# Patient Record
Sex: Female | Born: 1940 | Race: White | Hispanic: No | Marital: Married | Smoking: Never smoker
Health system: Southern US, Community
[De-identification: ages and names within clinical notes are randomized; demographics above are authoritative.]

## PROBLEM LIST (undated history)

## (undated) DIAGNOSIS — S72001A Fracture of unspecified part of neck of right femur, initial encounter for closed fracture: Secondary | ICD-10-CM

## (undated) DIAGNOSIS — Z789 Other specified health status: Secondary | ICD-10-CM

## (undated) DIAGNOSIS — D249 Benign neoplasm of unspecified breast: Secondary | ICD-10-CM

## (undated) DIAGNOSIS — C801 Malignant (primary) neoplasm, unspecified: Secondary | ICD-10-CM

## (undated) DIAGNOSIS — R51 Headache: Secondary | ICD-10-CM

## (undated) HISTORY — DX: Malignant (primary) neoplasm, unspecified: C80.1

## (undated) HISTORY — DX: Benign neoplasm of unspecified breast: D24.9

## (undated) HISTORY — DX: Fracture of unspecified part of neck of right femur, initial encounter for closed fracture: S72.001A

## (undated) HISTORY — PX: IM NAILING FEMORAL SHAFT FRACTURE: SUR731

---

## 1976-06-02 HISTORY — PX: TUBAL LIGATION: SHX77

## 1999-10-16 ENCOUNTER — Other Ambulatory Visit: Admission: RE | Admit: 1999-10-16 | Discharge: 1999-10-16 | Payer: Self-pay | Admitting: Gynecology

## 2000-10-19 ENCOUNTER — Other Ambulatory Visit: Admission: RE | Admit: 2000-10-19 | Discharge: 2000-10-19 | Payer: Self-pay | Admitting: Gynecology

## 2001-10-14 ENCOUNTER — Encounter: Payer: Self-pay | Admitting: Internal Medicine

## 2001-10-14 ENCOUNTER — Encounter: Admission: RE | Admit: 2001-10-14 | Discharge: 2001-10-14 | Payer: Self-pay | Admitting: Internal Medicine

## 2001-10-19 ENCOUNTER — Other Ambulatory Visit: Admission: RE | Admit: 2001-10-19 | Discharge: 2001-10-19 | Payer: Self-pay | Admitting: Gynecology

## 2001-11-18 ENCOUNTER — Other Ambulatory Visit: Admission: RE | Admit: 2001-11-18 | Discharge: 2001-11-18 | Payer: Self-pay | Admitting: Radiology

## 2001-12-14 ENCOUNTER — Ambulatory Visit (HOSPITAL_COMMUNITY): Admission: RE | Admit: 2001-12-14 | Discharge: 2001-12-14 | Payer: Self-pay | Admitting: *Deleted

## 2002-01-19 ENCOUNTER — Encounter: Payer: Self-pay | Admitting: Internal Medicine

## 2002-01-19 ENCOUNTER — Encounter: Admission: RE | Admit: 2002-01-19 | Discharge: 2002-01-19 | Payer: Self-pay | Admitting: Internal Medicine

## 2002-05-06 ENCOUNTER — Encounter: Admission: RE | Admit: 2002-05-06 | Discharge: 2002-05-06 | Payer: Self-pay | Admitting: Internal Medicine

## 2002-05-06 ENCOUNTER — Encounter: Payer: Self-pay | Admitting: Internal Medicine

## 2002-11-08 ENCOUNTER — Other Ambulatory Visit: Admission: RE | Admit: 2002-11-08 | Discharge: 2002-11-08 | Payer: Self-pay | Admitting: Gynecology

## 2003-06-03 HISTORY — PX: CARDIAC CATHETERIZATION: SHX172

## 2003-10-31 ENCOUNTER — Ambulatory Visit (HOSPITAL_COMMUNITY): Admission: RE | Admit: 2003-10-31 | Discharge: 2003-10-31 | Payer: Self-pay | Admitting: Cardiology

## 2003-11-09 ENCOUNTER — Other Ambulatory Visit: Admission: RE | Admit: 2003-11-09 | Discharge: 2003-11-09 | Payer: Self-pay | Admitting: Gynecology

## 2004-09-03 ENCOUNTER — Encounter: Admission: RE | Admit: 2004-09-03 | Discharge: 2004-09-03 | Payer: Self-pay | Admitting: Gynecology

## 2004-11-12 ENCOUNTER — Other Ambulatory Visit: Admission: RE | Admit: 2004-11-12 | Discharge: 2004-11-12 | Payer: Self-pay | Admitting: Gynecology

## 2005-09-03 ENCOUNTER — Encounter: Admission: RE | Admit: 2005-09-03 | Discharge: 2005-09-03 | Payer: Self-pay | Admitting: Gynecology

## 2005-11-24 ENCOUNTER — Other Ambulatory Visit: Admission: RE | Admit: 2005-11-24 | Discharge: 2005-11-24 | Payer: Self-pay | Admitting: Gynecology

## 2006-09-07 ENCOUNTER — Encounter: Admission: RE | Admit: 2006-09-07 | Discharge: 2006-09-07 | Payer: Self-pay | Admitting: Internal Medicine

## 2006-12-02 ENCOUNTER — Other Ambulatory Visit: Admission: RE | Admit: 2006-12-02 | Discharge: 2006-12-02 | Payer: Self-pay | Admitting: Gynecology

## 2007-09-15 ENCOUNTER — Encounter: Admission: RE | Admit: 2007-09-15 | Discharge: 2007-09-15 | Payer: Self-pay | Admitting: Gynecology

## 2008-09-15 ENCOUNTER — Encounter: Admission: RE | Admit: 2008-09-15 | Discharge: 2008-09-15 | Payer: Self-pay | Admitting: Gynecology

## 2009-09-18 ENCOUNTER — Encounter: Admission: RE | Admit: 2009-09-18 | Discharge: 2009-09-18 | Payer: Self-pay | Admitting: Internal Medicine

## 2010-08-20 ENCOUNTER — Other Ambulatory Visit: Payer: Self-pay | Admitting: Gynecology

## 2010-08-20 DIAGNOSIS — Z1231 Encounter for screening mammogram for malignant neoplasm of breast: Secondary | ICD-10-CM

## 2010-09-13 ENCOUNTER — Other Ambulatory Visit: Payer: Self-pay | Admitting: Internal Medicine

## 2010-09-13 DIAGNOSIS — R61 Generalized hyperhidrosis: Secondary | ICD-10-CM

## 2010-09-17 ENCOUNTER — Ambulatory Visit
Admission: RE | Admit: 2010-09-17 | Discharge: 2010-09-17 | Disposition: A | Discharge status: Home / Self Care | Payer: PRIVATE HEALTH INSURANCE | Source: Ambulatory Visit | Attending: Internal Medicine | Admitting: Internal Medicine

## 2010-09-17 DIAGNOSIS — R61 Generalized hyperhidrosis: Secondary | ICD-10-CM

## 2010-09-17 MED ORDER — IOHEXOL 300 MG/ML  SOLN
75.0000 mL | Freq: Once | INTRAMUSCULAR | Status: AC | PRN
  Administered 2010-09-17: 75 mL via INTRAVENOUS

## 2010-09-20 ENCOUNTER — Ambulatory Visit
Admission: RE | Admit: 2010-09-20 | Discharge: 2010-09-20 | Disposition: A | Discharge status: Home / Self Care | Payer: PRIVATE HEALTH INSURANCE | Source: Ambulatory Visit | Attending: Gynecology | Admitting: Gynecology

## 2010-09-20 DIAGNOSIS — Z1231 Encounter for screening mammogram for malignant neoplasm of breast: Secondary | ICD-10-CM

## 2010-10-18 NOTE — Cardiovascular Report (Signed)
NAME:  Darlene Day, Darlene Day                        ACCOUNT NO.:  [id number]   MEDICAL RECORD NO.:  [id number]                   PATIENT TYPE:  OIB   LOCATION:  2899                                 FACILITY:  [facility]   PHYSICIAN:  Aram Candela. Tysinger, M.D.              DATE OF BIRTH:  1941-02-26   DATE OF PROCEDURE:  10/31/2003  DATE OF DISCHARGE:                              CARDIAC CATHETERIZATION   PROCEDURE:  1. Left heart catheterization.  2. Coronary cineangiography.  3. Left ventricular cine angiography.  4. AngioSeal of the right femoral artery.   INDICATIONS FOR PROCEDURE:  Recent onset of chest pain with positive  treadmill exercise tolerance test.   DESCRIPTION OF PROCEDURE:  After signing an informed consent, the patient  was premedicated with 5 mg of Valium by mouth and brought to the cardiac  catheterization lab.  Her right groin was prepped and draped in the sterile  fashion and anesthetized locally with 1% lidocaine.  A 6 French introducer  sheath was inserted percutaneously into the right femoral artery.  The 6  [place] #4 Judkins coronary catheters were used to make injections into the  native coronary arteries.  A 6 French pigtail catheter was used to measures  in the left ventricle and aorta and to make mid stream into the left  ventricle.  A pull back across the RCA was recorded.  The patient tolerated  the procedure well.  No complications were noted.  At the end of the  procedure, the catheter and sheath were removed from the right femoral  artery and hemostasis was easily obtained with a Perclose closure system.   MEDICATIONS GIVEN:  None.   CINE FINDINGS:  Coronary cine angiography:   Left coronary artery:  The ostium and left main appeared normal.   The left anterior descending appears normal.   The circumflex coronary artery appears normal.   The right coronary artery appears normal.   There is a balanced circulation to the posterior descending and  posterior  lateral circulation.   LEFT VENTRICULAR CINE ANGIOGRAM:  The left ventricular chamber size and  contractility appeared normal.  The mitral and aortic valves also appeared  normal.  There were no segmental abnormalities.   INTERPRETATION:  1. Normal coronary arteries.  2. Normal left ventricular function.  3. Normal mitral and aortic valves.  4. Successful AngioSeal of the right femoral artery.   DISPOSITION:  Deferred at present.  We will follow up in the office and  ultimately refer her back to Dr. Lendell Caprice for further medical treatment.                                               John R. Tysinger, M.D.    JRT/MEDQ  D:  10/31/2003  T:  10/31/2003  Job:  [id number]   cc:   Janae Bridgeman. Eloise Harman., M.D.  [address]  [place]  [place] [address]  Fax: [phone]   Catheterization Lab

## 2010-10-18 NOTE — Op Note (Signed)
[place]  Patient:    Darlene Day, Darlene Day Visit Number: [id number] MRN: [id number]          Service Type: END Location: ENDO Attending Physician:  Sabino Gasser Dictated by:   Sabino Gasser, M.D. Proc. Date: 12/14/01 Admit Date:  12/14/2001 Discharge Date: 12/14/2001                             Operative Report  PROCEDURE:  Colonoscopy.  INDICATIONS:  Rectal bleeding, colon cancer screening.  ANESTHESIA:  Demerol 70, Versed 10 mg.  DESCRIPTION OF THE PROCEDURE:  With the patient mildly sedated in the left lateral decubitus position, the Olympus videoscopic colonoscope was inserted into the rectum and passed under direct visualization to the cecum, identified by the ileocecal valve and appendiceal orifice, both of which were photographed. From this point the colonoscope was slowly withdrawn, taking circumferential views of the entire colonic mucosa, stopping only in the rectum, which appeared normal. The rectum showed hemorrhoids in the retroflex view. The endoscope was straightened and withdrawn.  The patients vital signs and pulse oximeter remained stable. The patient tolerated the procedure well without apparent complications.  FINDINGS:  Internal hemorrhoids, otherwise unremarkable examination to the cecum.  PLAN:  See the patient back in 60 months or as needed. Dictated by:   Sabino Gasser, M.D. Attending Physician:  Sabino Gasser DD:  12/14/01 TD:  12/16/01 Job: [id number]

## 2011-03-25 ENCOUNTER — Other Ambulatory Visit: Payer: Self-pay | Admitting: Internal Medicine

## 2011-03-25 DIAGNOSIS — R1011 Right upper quadrant pain: Secondary | ICD-10-CM

## 2011-03-27 ENCOUNTER — Ambulatory Visit
Admission: RE | Admit: 2011-03-27 | Discharge: 2011-03-27 | Disposition: A | Discharge status: Home / Self Care | Payer: PRIVATE HEALTH INSURANCE | Source: Ambulatory Visit | Attending: Internal Medicine | Admitting: Internal Medicine

## 2011-03-27 DIAGNOSIS — R1011 Right upper quadrant pain: Secondary | ICD-10-CM

## 2011-07-02 ENCOUNTER — Other Ambulatory Visit: Payer: Self-pay | Admitting: Gynecology

## 2011-07-02 DIAGNOSIS — N6310 Unspecified lump in the right breast, unspecified quadrant: Secondary | ICD-10-CM

## 2011-07-11 ENCOUNTER — Ambulatory Visit
Admission: RE | Admit: 2011-07-11 | Discharge: 2011-07-11 | Disposition: A | Discharge status: Home / Self Care | Payer: Commercial Managed Care - PPO | Source: Ambulatory Visit | Attending: Gynecology | Admitting: Gynecology

## 2011-07-11 ENCOUNTER — Other Ambulatory Visit: Payer: Self-pay | Admitting: Gynecology

## 2011-07-11 DIAGNOSIS — N6310 Unspecified lump in the right breast, unspecified quadrant: Secondary | ICD-10-CM

## 2011-07-18 ENCOUNTER — Ambulatory Visit
Admission: RE | Admit: 2011-07-18 | Discharge: 2011-07-18 | Disposition: A | Discharge status: Home / Self Care | Payer: Commercial Managed Care - PPO | Source: Ambulatory Visit | Attending: Gynecology | Admitting: Gynecology

## 2011-07-18 DIAGNOSIS — N6310 Unspecified lump in the right breast, unspecified quadrant: Secondary | ICD-10-CM

## 2011-08-01 DIAGNOSIS — C801 Malignant (primary) neoplasm, unspecified: Secondary | ICD-10-CM

## 2011-08-01 HISTORY — DX: Malignant (primary) neoplasm, unspecified: C80.1

## 2011-08-01 HISTORY — PX: BREAST SURGERY: SHX581

## 2011-08-06 ENCOUNTER — Encounter (INDEPENDENT_AMBULATORY_CARE_PROVIDER_SITE_OTHER): Payer: Self-pay | Admitting: Surgery

## 2011-08-06 ENCOUNTER — Ambulatory Visit (INDEPENDENT_AMBULATORY_CARE_PROVIDER_SITE_OTHER): Payer: Commercial Managed Care - PPO | Admitting: Surgery

## 2011-08-06 VITALS — BP 160/88 | HR 80 | Temp 99.5°F | Resp 16 | Ht 70.0 in | Wt 153.0 lb

## 2011-08-06 DIAGNOSIS — C50111 Malignant neoplasm of central portion of right female breast: Secondary | ICD-10-CM | POA: Insufficient documentation

## 2011-08-06 DIAGNOSIS — N6310 Unspecified lump in the right breast, unspecified quadrant: Secondary | ICD-10-CM

## 2011-08-06 DIAGNOSIS — N63 Unspecified lump in unspecified breast: Secondary | ICD-10-CM

## 2011-08-06 NOTE — Patient Instructions (Signed)
We will schedule surgery to remove the lump from your right breast that appears to be a papilloma.We will wait about three weeks to allow the bruising from the biopsy to improve

## 2011-08-06 NOTE — Progress Notes (Signed)
NAME: Darlene Day DOB: 05/12/1941 MRN: [id number]                                                                                      DATE: 08/06/2011  PCP: KIM, JAMES, MD, MD Referring Provider: Lomax, Charles W, MD  IMPRESSION:  Right breast papilloma, 6 o'clock position areolar margin  PLAN:   Excision. This is palpable so do not need to do wire loc. Want to wait for ecchymosis to improve. Discussed risks and complications of surgery and she wishes to proceed. I told her that this is most likely benign. An alternative is a six month f/u exam and sono.                 CC:  Chief Complaint  Patient presents with  . Breast Problem    new pt- eval rt breast papilloma    HPI:  Darlene Day is a 71 y.o.  female who presents for evaluation of a right breast mass. She noted it and has now had mammogram, sono, and NCB. Path shows papilloma. She has a history of cysts that have been aspirated. Remote FH of breast cancer. No breast sx.  PMH:  has a past medical history of Papilloma of breast.  PSH:   has past surgical history that includes Tubal ligation (1978).  ALLERGIES:   Allergies  Allergen Reactions  . Codeine Nausea And Vomiting    MEDICATIONS: Current outpatient prescriptions:Calcium Citrate-Vitamin D (CITRACAL PETITES/VITAMIN D PO), Take 400 mg by mouth daily., Disp: , Rfl: ;  Cholecalciferol (VITAMIN D-3 PO), Take 2,000 Units by mouth 2 (two) times daily., Disp: , Rfl: ;  Cinnamon 500 MG capsule, Take 500 mg by mouth daily., Disp: , Rfl: ;  estradiol (VIVELLE-DOT) 0.025 MG/24HR, Place 1 patch onto the skin 2 (two) times a week., Disp: , Rfl:  ibandronate (BONIVA) 150 MG tablet, Take 150 mg by mouth every 30 (thirty) days. Take in the morning with a full glass of water, on an empty stomach, and do not take anything else by mouth or lie down for the next 30 min., Disp: , Rfl: ;  Lysine 500 MG CAPS, Take by mouth., Disp: , Rfl: ;  Multiple Vitamin (MULTIVITAMINS PO), Take by  mouth daily., Disp: , Rfl: ;  progesterone (PROMETRIUM) 200 MG capsule, Take 200 mg by mouth daily., Disp: , Rfl:  Psyllium (METAMUCIL PO), Take by mouth daily., Disp: , Rfl: ;  vitamin C (ASCORBIC ACID) 500 MG tablet, Take 500 mg by mouth daily., Disp: , Rfl:   ROS: She has filled out our 12 point review of systems and it is negative.   EXAM:   GENERAL:  The patient is alert, oriented, and generally healthy-appearing, NAD. Mood and affect are normal.  HEENT:  The head is normocephalic, the eyes nonicteric, the pupils were round regular and equal. EOMs are normal. Pharynx normal. Dentition good.  NECK:  The neck is supple and there are no masses or thyromegaly.  LUNGS: Normal respirations and clear to auscultation.  HEART: Regular rhythm, with no murmurs rubs or gallops. Pulses are intact carotid dorsalis pedis and posterior tibial. No   significant varicosities are noted.  BREASTS:  Right has an eccymosis around the areolar area and extending somewhat inferiorly. There is a mass at the 6 o'clock position at the areolar margin, about 2 cm. This is what she felt, but seems bigger since the biopsy  ABDOMEN: Soft, flat, and nontender. No masses or organomegaly is noted. No hernias are noted. Bowel sounds are normal.  EXTREMITIES:  Good range of motion, no edema.   DATA REVIEWED:  Mammogram, sono and path all reviewed    Adit Riddles J 08/06/2011  CC: Lomax, Charles W, MD, KIM, JAMES, MD, MD        

## 2011-08-15 ENCOUNTER — Encounter (HOSPITAL_BASED_OUTPATIENT_CLINIC_OR_DEPARTMENT_OTHER): Payer: Self-pay | Admitting: *Deleted

## 2011-08-15 NOTE — Progress Notes (Signed)
Not diabetic, no htn-still works full time

## 2011-08-21 ENCOUNTER — Encounter (HOSPITAL_BASED_OUTPATIENT_CLINIC_OR_DEPARTMENT_OTHER): Payer: Self-pay | Admitting: *Deleted

## 2011-08-21 ENCOUNTER — Ambulatory Visit (HOSPITAL_BASED_OUTPATIENT_CLINIC_OR_DEPARTMENT_OTHER): Payer: Commercial Managed Care - PPO | Admitting: *Deleted

## 2011-08-21 ENCOUNTER — Encounter (HOSPITAL_BASED_OUTPATIENT_CLINIC_OR_DEPARTMENT_OTHER)
Admission: RE | Disposition: A | Discharge status: Home / Self Care | Payer: Self-pay | Source: Ambulatory Visit | Attending: Surgery

## 2011-08-21 ENCOUNTER — Ambulatory Visit (HOSPITAL_BASED_OUTPATIENT_CLINIC_OR_DEPARTMENT_OTHER)
Admission: RE | Admit: 2011-08-21 | Discharge: 2011-08-21 | Disposition: A | Discharge status: Home / Self Care | Payer: Commercial Managed Care - PPO | Source: Ambulatory Visit | Attending: Surgery | Admitting: Surgery

## 2011-08-21 DIAGNOSIS — D0590 Unspecified type of carcinoma in situ of unspecified breast: Secondary | ICD-10-CM | POA: Insufficient documentation

## 2011-08-21 DIAGNOSIS — N6310 Unspecified lump in the right breast, unspecified quadrant: Secondary | ICD-10-CM

## 2011-08-21 HISTORY — DX: Headache: R51

## 2011-08-21 HISTORY — DX: Other specified health status: Z78.9

## 2011-08-21 LAB — POCT HEMOGLOBIN-HEMACUE: Hemoglobin: 12.7 g/dL (ref 12.0–15.0)

## 2011-08-21 SURGERY — EXCISION OF BREAST BIOPSY
Anesthesia: Choice | Site: Breast | Laterality: Right | Wound class: Clean

## 2011-08-21 MED ORDER — LIDOCAINE HCL (CARDIAC) 20 MG/ML IV SOLN
INTRAVENOUS | Status: DC | PRN
  Administered 2011-08-21: 40 mg via INTRAVENOUS

## 2011-08-21 MED ORDER — CHLORHEXIDINE GLUCONATE 4 % EX LIQD: 1.0000 "application " | Freq: Once | CUTANEOUS | Status: DC

## 2011-08-21 MED ORDER — PROPOFOL 10 MG/ML IV EMUL
INTRAVENOUS | Status: DC | PRN
  Administered 2011-08-21: 120 mg via INTRAVENOUS

## 2011-08-21 MED ORDER — PROMETHAZINE HCL 25 MG/ML IJ SOLN: 6.2500 mg | INTRAMUSCULAR | Status: DC | PRN

## 2011-08-21 MED ORDER — FENTANYL CITRATE 0.05 MG/ML IJ SOLN: 25.0000 ug | INTRAMUSCULAR | Status: DC | PRN

## 2011-08-21 MED ORDER — CEFAZOLIN SODIUM 1-5 GM-% IV SOLN
1.0000 g | INTRAVENOUS | Status: AC
  Administered 2011-08-21: 1 g via INTRAVENOUS

## 2011-08-21 MED ORDER — BUPIVACAINE HCL (PF) 0.25 % IJ SOLN
INTRAMUSCULAR | Status: DC | PRN
  Administered 2011-08-21: 19 mL

## 2011-08-21 MED ORDER — DEXAMETHASONE SODIUM PHOSPHATE 4 MG/ML IJ SOLN
INTRAMUSCULAR | Status: DC | PRN
  Administered 2011-08-21: 10 mg via INTRAVENOUS

## 2011-08-21 MED ORDER — ONDANSETRON HCL 4 MG/2ML IJ SOLN
INTRAMUSCULAR | Status: DC | PRN
  Administered 2011-08-21: 4 mg via INTRAVENOUS

## 2011-08-21 MED ORDER — LACTATED RINGERS IV SOLN
INTRAVENOUS | Status: DC
  Administered 2011-08-21: 07:00:00 via INTRAVENOUS

## 2011-08-21 MED ORDER — FENTANYL CITRATE 0.05 MG/ML IJ SOLN
INTRAMUSCULAR | Status: DC | PRN
  Administered 2011-08-21: 25 ug via INTRAVENOUS
  Administered 2011-08-21: 50 ug via INTRAVENOUS

## 2011-08-21 MED ORDER — HYDROCODONE-ACETAMINOPHEN 5-325 MG PO TABS: 1.0000 | ORAL_TABLET | ORAL | Status: AC | PRN

## 2011-08-21 SURGICAL SUPPLY — 49 items
ADH SKN CLS APL DERMABOND .7 (GAUZE/BANDAGES/DRESSINGS) ×1
APPLICATOR COTTON TIP 6IN STRL (MISCELLANEOUS) IMPLANT
BINDER BREAST LRG (GAUZE/BANDAGES/DRESSINGS) ×1 IMPLANT
BINDER BREAST MEDIUM (GAUZE/BANDAGES/DRESSINGS) IMPLANT
BINDER BREAST XLRG (GAUZE/BANDAGES/DRESSINGS) IMPLANT
BINDER BREAST XXLRG (GAUZE/BANDAGES/DRESSINGS) IMPLANT
BLADE HEX COATED 2.75 (ELECTRODE) ×2 IMPLANT
BLADE SURG 15 STRL LF DISP TIS (BLADE) ×1 IMPLANT
BLADE SURG 15 STRL SS (BLADE) ×2
CANISTER SUCTION 1200CC (MISCELLANEOUS) ×2 IMPLANT
CHLORAPREP W/TINT 26ML (MISCELLANEOUS) ×2 IMPLANT
CLIP TI MEDIUM 6 (CLIP) IMPLANT
CLIP TI WIDE RED SMALL 6 (CLIP) IMPLANT
CLOTH BEACON ORANGE TIMEOUT ST (SAFETY) ×2 IMPLANT
COVER MAYO STAND STRL (DRAPES) ×2 IMPLANT
COVER TABLE BACK 60X90 (DRAPES) ×2 IMPLANT
DECANTER SPIKE VIAL GLASS SM (MISCELLANEOUS) IMPLANT
DERMABOND ADVANCED (GAUZE/BANDAGES/DRESSINGS) ×1
DERMABOND ADVANCED .7 DNX12 (GAUZE/BANDAGES/DRESSINGS) ×1 IMPLANT
DEVICE DUBIN W/COMP PLATE 8390 (MISCELLANEOUS) IMPLANT
DRAPE LAPAROTOMY TRNSV 102X78 (DRAPE) ×2 IMPLANT
DRAPE UTILITY XL STRL (DRAPES) ×2 IMPLANT
ELECT REM PT RETURN 9FT ADLT (ELECTROSURGICAL) ×2
ELECTRODE REM PT RTRN 9FT ADLT (ELECTROSURGICAL) ×1 IMPLANT
GLOVE ECLIPSE 6.5 STRL STRAW (GLOVE) ×1 IMPLANT
GLOVE EUDERMIC 7 POWDERFREE (GLOVE) ×2 IMPLANT
GLOVE INDICATOR 7.0 STRL GRN (GLOVE) ×1 IMPLANT
GOWN PREVENTION PLUS XLARGE (GOWN DISPOSABLE) ×4 IMPLANT
KIT MARKER MARGIN INK (KITS) IMPLANT
MARKER PEN SURG W/LABELS BLK (STERILIZATION PRODUCTS) ×1 IMPLANT
MARKER SKIN SURG 5.25 VIO NS (MISCELLANEOUS) IMPLANT
NDL HYPO 25X1 1.5 SAFETY (NEEDLE) ×1 IMPLANT
NEEDLE HYPO 25X1 1.5 SAFETY (NEEDLE) ×2 IMPLANT
NS IRRIG 1000ML POUR BTL (IV SOLUTION) IMPLANT
PACK BASIN DAY SURGERY FS (CUSTOM PROCEDURE TRAY) ×2 IMPLANT
PENCIL BUTTON HOLSTER BLD 10FT (ELECTRODE) ×2 IMPLANT
SLEEVE SCD COMPRESS KNEE MED (MISCELLANEOUS) ×2 IMPLANT
SPONGE GAUZE 4X4 12PLY (GAUZE/BANDAGES/DRESSINGS) IMPLANT
SPONGE INTESTINAL PEANUT (DISPOSABLE) IMPLANT
SPONGE LAP 4X18 X RAY DECT (DISPOSABLE) ×2 IMPLANT
STAPLER VISISTAT 35W (STAPLE) IMPLANT
SUT MNCRL AB 4-0 PS2 18 (SUTURE) ×2 IMPLANT
SUT SILK 0 TIES 10X30 (SUTURE) IMPLANT
SUT VICRYL 3-0 CR8 SH (SUTURE) ×2 IMPLANT
SYR CONTROL 10ML LL (SYRINGE) ×2 IMPLANT
TOWEL OR NON WOVEN STRL DISP B (DISPOSABLE) ×2 IMPLANT
TUBE CONNECTING 20X1/4 (TUBING) ×2 IMPLANT
WATER STERILE IRR 1000ML POUR (IV SOLUTION) ×1 IMPLANT
YANKAUER SUCT BULB TIP NO VENT (SUCTIONS) ×2 IMPLANT

## 2011-08-21 NOTE — Op Note (Signed)
SYNDI PUA 06/30/1940 [id number] 08/06/2011  Preoperative diagnosis: Mass right breast  Postoperative diagnosis: Same   Procedure: Excisional biopsy right breast mass  Surgeon: Currie Paris, MD, FACS  Assistant: None  Anesthesia: General   Clinical History and Indications: This patient had a palpable mass in the right breast 6:00 position. On needle core biopsy had showed probable papilloma and excisional biopsy was recommended. The patient agreed.    Description of Procedure: The patient was seen in the preoperative area and the plans for the procedure reviewed. I marked the right breast. All questions were answered.  The patient was taken to the operating room and after satisfactory general anesthesia had been obtained the right breast was prepped and draped in the time out was done. I made a curvilinear incision along the areolar margin a centered on the 6:00 position. The Superiorflap was raised underneath the nipple and the mass was palpable.Using cautery the mass was excised to try to take some normal breast tissue around it. There is evidence of prior post needle biopsy bleeding in the tissues. I thought the area was out completely. A specimen mammogram showed the clip in the specimen.  I then irrigated. I put in 20 cc of 0.25% plain Marcaine for postop pain relief. I closed in layers with 3-0 Vicryl, 4-0 Monocryl subcuticular and Dermabond.  The patient tolerated the procedure well. There are no operative complications and all counts were correct.  Currie Paris, MD, FACS 08/21/2011 7:59 AM

## 2011-08-21 NOTE — Anesthesia Postprocedure Evaluation (Signed)
  Anesthesia Post-op Note  Patient: Darlene Day  Procedure(s) Performed: Procedure(s) (LRB): EXCISION OF BREAST BIOPSY (Right)  Patient Location: PACU  Anesthesia Type: General  Level of Consciousness: awake  Airway and Oxygen Therapy: Patient Spontanous Breathing  Post-op Pain: mild  Post-op Assessment: Post-op Vital signs reviewed, Patient's Cardiovascular Status Stable, Respiratory Function Stable, Patent Airway and No signs of Nausea or vomiting  Post-op Vital Signs: Reviewed and stable  Complications: No apparent anesthesia complications

## 2011-08-21 NOTE — Transfer of Care (Signed)
Immediate Anesthesia Transfer of Care Note  Patient: Darlene Day  Procedure(s) Performed: Procedure(s) (LRB): EXCISION OF BREAST BIOPSY (Right)  Patient Location: PACU  Anesthesia Type: General  Level of Consciousness: awake, oriented and patient cooperative  Airway & Oxygen Therapy: Patient Spontanous Breathing and Patient connected to face mask oxygen  Post-op Assessment: Report given to PACU RN, Post -op Vital signs reviewed and stable and Patient moving all extremities  Post vital signs: Reviewed and stable  Complications: No apparent anesthesia complications

## 2011-08-21 NOTE — Discharge Instructions (Signed)
[facility]  [address] [place], [place] [address] [phone]   Post Anesthesia Home Care Instructions  Activity: Get plenty of rest for the remainder of the day. A responsible adult should stay with you for 24 hours following the procedure.  For the next 24 hours, DO NOT: -Drive a car -Operate machinery -Drink alcoholic beverages -Take any medication unless instructed by your physician -Make any legal decisions or sign important papers.  Meals: Start with liquid foods such as gelatin or soup. Progress to regular foods as tolerated. Avoid greasy, spicy, heavy foods. If nausea and/or vomiting occur, drink only clear liquids until the nausea and/or vomiting subsides. Call your physician if vomiting continues.  Special Instructions/Symptoms: Your throat may feel dry or sore from the anesthesia or the breathing tube placed in your throat during surgery. If this causes discomfort, gargle with warm salt water. The discomfort should disappear within 24 hours.  Call your surgeon if you experience:   1.  Fever over 101.0. 2.  Inability to urinate. 3.  Nausea and/or vomiting. 4.  Extreme swelling or bruising at the surgical site. 5.  Continued bleeding from the incision. 6.  Increased pain, redness or drainage from the incision. 7.  Problems related to your pain medication.  

## 2011-08-21 NOTE — Anesthesia Procedure Notes (Signed)
Procedure Name: LMA Insertion Date/Time: 08/21/2011 7:26 AM Performed by: Meyer Russel Pre-anesthesia Checklist: Patient identified, Emergency Drugs available, Suction available, Patient being monitored and Timeout performed Patient Re-evaluated:Patient Re-evaluated prior to inductionOxygen Delivery Method: Circle System Utilized Preoxygenation: Pre-oxygenation with 100% oxygen Intubation Type: IV induction Ventilation: Mask ventilation without difficulty LMA: LMA inserted LMA Size: 4.0 Number of attempts: 1 Airway Equipment and Method: bite block Placement Confirmation: positive ETCO2 and breath sounds checked- equal and bilateral Tube secured with: Tape Dental Injury: Teeth and Oropharynx as per pre-operative assessment

## 2011-08-21 NOTE — Interval H&P Note (Signed)
History and Physical Interval Note:  08/21/2011 7:11 AM  Darlene Day  has presented today for surgery, with the diagnosis of right breast mass  The various methods of treatment have been discussed with the patient and family. After consideration of risks, benefits and other options for treatment, the patient has consented to  Procedure(s) (LRB): EXCISION OF BREAST BIOPSY (Right) as a surgical intervention .  The patients' history has been reviewed, patient examined, no change in status, stable for surgery.  I have reviewed the patients' chart and labs.  Questions were answered to the patient's satisfaction.  I have marked the right breast as the operative site   Darlene Day J

## 2011-08-21 NOTE — Anesthesia Preprocedure Evaluation (Addendum)
Anesthesia Evaluation  Patient identified by MRN, date of birth, ID band Patient awake    Reviewed: Allergy & Precautions, H&P , NPO status , Patient's Chart, lab work & pertinent test results  Airway Mallampati: I TM Distance: >3 FB Neck ROM: Full    Dental No notable dental hx. (+) Teeth Intact   Pulmonary neg pulmonary ROS,    Pulmonary exam normal       Cardiovascular negative cardio ROS      Neuro/Psych negative neurological ROS  negative psych ROS   GI/Hepatic negative GI ROS, Neg liver ROS,   Endo/Other  negative endocrine ROS  Renal/GU negative Renal ROS  negative genitourinary   Musculoskeletal   Abdominal   Peds  Hematology negative hematology ROS (+)   Anesthesia Other Findings   Reproductive/Obstetrics negative OB ROS                           Anesthesia Physical Anesthesia Plan  ASA: I  Anesthesia Plan: General   Post-op Pain Management:    Induction: Intravenous  Airway Management Planned: LMA  Additional Equipment:   Intra-op Plan:   Post-operative Plan: Extubation in OR  Informed Consent: I have reviewed the patients History and Physical, chart, labs and discussed the procedure including the risks, benefits and alternatives for the proposed anesthesia with the patient or authorized representative who has indicated his/her understanding and acceptance.   Dental Advisory Given  Plan Discussed with: CRNA  Anesthesia Plan Comments:        Anesthesia Quick Evaluation

## 2011-08-21 NOTE — H&P (View-Only) (Signed)
NAMESTEPHNIE Day DOB: 1941-05-28 MRN: [id number]                                                                                      DATE: 08/06/2011  PCP: Pearson Grippe, MD, MD Referring Provider: Gretta Cool, MD  IMPRESSION:  Right breast papilloma, 6 o'clock position areolar margin  PLAN:   Excision. This is palpable so do not need to do wire loc. Want to wait for ecchymosis to improve. Discussed risks and complications of surgery and she wishes to proceed. I told her that this is most likely benign. An alternative is a six month f/u exam and sono.                 CC:  Chief Complaint  Patient presents with  . Breast Problem    new pt- eval rt breast papilloma    HPI:  Darlene Day is a 71 y.o.  female who presents for evaluation of a right breast mass. She noted it and has now had mammogram, sono, and NCB. Path shows papilloma. She has a history of cysts that have been aspirated. Remote FH of breast cancer. No breast sx.  PMH:  has a past medical history of Papilloma of breast.  PSH:   has past surgical history that includes Tubal ligation (1978).  ALLERGIES:   Allergies  Allergen Reactions  . Codeine Nausea And Vomiting    MEDICATIONS: Current outpatient prescriptions:Calcium Citrate-Vitamin D (CITRACAL PETITES/VITAMIN D PO), Take 400 mg by mouth daily., Disp: , Rfl: ;  Cholecalciferol (VITAMIN D-3 PO), Take 2,000 Units by mouth 2 (two) times daily., Disp: , Rfl: ;  Cinnamon 500 MG capsule, Take 500 mg by mouth daily., Disp: , Rfl: ;  estradiol (VIVELLE-DOT) 0.025 MG/24HR, Place 1 patch onto the skin 2 (two) times a week., Disp: , Rfl:  ibandronate (BONIVA) 150 MG tablet, Take 150 mg by mouth every 30 (thirty) days. Take in the morning with a full glass of water, on an empty stomach, and do not take anything else by mouth or lie down for the next 30 min., Disp: , Rfl: ;  Lysine 500 MG CAPS, Take by mouth., Disp: , Rfl: ;  Multiple Vitamin (MULTIVITAMINS PO), Take by  mouth daily., Disp: , Rfl: ;  progesterone (PROMETRIUM) 200 MG capsule, Take 200 mg by mouth daily., Disp: , Rfl:  Psyllium (METAMUCIL PO), Take by mouth daily., Disp: , Rfl: ;  vitamin C (ASCORBIC ACID) 500 MG tablet, Take 500 mg by mouth daily., Disp: , Rfl:   ROS: She has filled out our 12 point review of systems and it is negative.   EXAM:   GENERAL:  The patient is alert, oriented, and generally healthy-appearing, NAD. Mood and affect are normal.  HEENT:  The head is normocephalic, the eyes nonicteric, the pupils were round regular and equal. EOMs are normal. Pharynx normal. Dentition good.  NECK:  The neck is supple and there are no masses or thyromegaly.  LUNGS: Normal respirations and clear to auscultation.  HEART: Regular rhythm, with no murmurs rubs or gallops. Pulses are intact carotid dorsalis pedis and posterior tibial. No  significant varicosities are noted.  BREASTS:  Right has an eccymosis around the areolar area and extending somewhat inferiorly. There is a mass at the 6 o'clock position at the areolar margin, about 2 cm. This is what she felt, but seems bigger since the biopsy  ABDOMEN: Soft, flat, and nontender. No masses or organomegaly is noted. No hernias are noted. Bowel sounds are normal.  EXTREMITIES:  Good range of motion, no edema.   DATA REVIEWED:  Mammogram, sono and path all reviewed    Kynnadi Dicenso J 08/06/2011  CC: Gretta Cool, MD, Pearson Grippe, MD, MD

## 2011-08-22 ENCOUNTER — Telehealth (INDEPENDENT_AMBULATORY_CARE_PROVIDER_SITE_OTHER): Payer: Self-pay | Admitting: General Surgery

## 2011-08-22 ENCOUNTER — Telehealth (INDEPENDENT_AMBULATORY_CARE_PROVIDER_SITE_OTHER): Payer: Self-pay | Admitting: Surgery

## 2011-08-22 DIAGNOSIS — C50919 Malignant neoplasm of unspecified site of unspecified female breast: Secondary | ICD-10-CM

## 2011-08-22 NOTE — Telephone Encounter (Signed)
She says she is doing well post op. I reviewed the path report with her over the phone and told her we would get an MRI as the next step

## 2011-08-22 NOTE — Telephone Encounter (Signed)
Message copied by Liliana Cline on Fri Aug 22, 2011  4:55 PM ------      Message from: Currie Paris      Created: Fri Aug 22, 2011  4:32 PM       Lesly Rubenstein,      I called her the path report and added her to the cancer conference for April 4. She needs to have an MRI before then and she is aware of this

## 2011-08-25 ENCOUNTER — Other Ambulatory Visit (INDEPENDENT_AMBULATORY_CARE_PROVIDER_SITE_OTHER): Payer: Self-pay | Admitting: General Surgery

## 2011-08-25 DIAGNOSIS — C50919 Malignant neoplasm of unspecified site of unspecified female breast: Secondary | ICD-10-CM

## 2011-08-28 ENCOUNTER — Ambulatory Visit
Admission: RE | Admit: 2011-08-28 | Discharge: 2011-08-28 | Disposition: A | Discharge status: Home / Self Care | Payer: Commercial Managed Care - PPO | Source: Ambulatory Visit | Attending: Surgery | Admitting: Surgery

## 2011-08-28 DIAGNOSIS — C50919 Malignant neoplasm of unspecified site of unspecified female breast: Secondary | ICD-10-CM

## 2011-08-28 MED ORDER — GADOBENATE DIMEGLUMINE 529 MG/ML IV SOLN
14.0000 mL | Freq: Once | INTRAVENOUS | Status: AC | PRN
  Administered 2011-08-28: 14 mL via INTRAVENOUS

## 2011-09-01 ENCOUNTER — Ambulatory Visit (INDEPENDENT_AMBULATORY_CARE_PROVIDER_SITE_OTHER): Payer: Commercial Managed Care - PPO | Admitting: Surgery

## 2011-09-01 ENCOUNTER — Encounter (INDEPENDENT_AMBULATORY_CARE_PROVIDER_SITE_OTHER): Payer: Self-pay | Admitting: Surgery

## 2011-09-01 VITALS — BP 128/80 | HR 69 | Temp 98.3°F | Resp 16 | Ht 70.0 in | Wt 154.2 lb

## 2011-09-01 DIAGNOSIS — C50119 Malignant neoplasm of central portion of unspecified female breast: Secondary | ICD-10-CM

## 2011-09-01 NOTE — Progress Notes (Signed)
NAME: Darlene Day                                            DOB: 19-Jan-1941 DATE: 09/01/2011                                                  MRN: [id number]  CC: Post op   HPI: This patient comes in for post op follow-up.Sheunderwent removal of a right subarealoar breast mass on 3/21. 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 MRI since has shown a 3 cm area with suspicion for invasive disease  PE: General: The patient appears to be healthy, NAD Incision healing nicely  DATA REVIEWED: Path report and MRI  IMPRESSION: The patient is doing well S/P removal of right breast mass.  I have explained the pathophysiology and staging of breast cancer with particular attention to her exact situation. We discussed the multidisciplinary approach to breast cancer which often includes both medical and radiation oncology consultations.  We also discussed surgical options for the treatment of breast cancer including lumpectomy and mastectomy with possible reconstructive surgery. In addition we talked about the evaluation and management of lymph nodes including a description of sentinel lymph node biopsy and axillary dissections. We reviewed potential complications and risks including bleeding, infection, numbness,  lymphedema, and the potential need for additional surgery.  She understands that for patients who are candidate for lumpectomy or mastectomy there is an equal survival rate with either technique, but a slightly higher local recurrence rate with lumpectomy. In addition she knows that a lumpectomy usually requires postoperative radiation as part of the management of the breast cancer.  We have discussed the likely postoperative course and plans for followup.  I have given the patient some written information that reviewed all of these issues. I believe her questions are answered and that she has a good understanding of the issues. She  would like to proceed to mastectomy and is not interested in reconstruction.  Marland Kitchen    PLAN: Will schedule R total mastectomy and SLN

## 2011-09-01 NOTE — Patient Instructions (Signed)
We will schedule surgery for a mastectomy - removal of the right breast - and also removal of some lymph nodes in the right armpit area.

## 2011-09-04 ENCOUNTER — Telehealth: Payer: Self-pay | Admitting: *Deleted

## 2011-09-04 NOTE — Telephone Encounter (Signed)
Confirmed 09/08/11 genetics appt w/ pt.

## 2011-09-08 ENCOUNTER — Ambulatory Visit (HOSPITAL_BASED_OUTPATIENT_CLINIC_OR_DEPARTMENT_OTHER): Payer: Commercial Managed Care - PPO | Admitting: Genetic Counselor

## 2011-09-08 ENCOUNTER — Other Ambulatory Visit: Payer: Commercial Managed Care - PPO | Admitting: Lab

## 2011-09-08 DIAGNOSIS — C50119 Malignant neoplasm of central portion of unspecified female breast: Secondary | ICD-10-CM

## 2011-09-08 NOTE — Progress Notes (Signed)
Dr. Jamey Ripa requested a consultation for genetic counseling and risk assessment for Darlene Day, a 71 y.o. female, for discussion of her personal and family history of breast cancer. She presents to clinic today to discuss the possibility of a genetic predisposition to cancer, and to further clarify her risks, as well as her family members' risks for cancer.   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     Past Medical History  Diagnosis Date  . Papilloma of breast     right breast  . No pertinent past medical history   . Headache     hx    Past Surgical History  Procedure Date  . Tubal ligation 1978  . Colonoscopy   . Cardiac catheterization 2005    normal  . Breast surgery     History  Substance Use Topics  . Smoking status: Never Smoker   . Smokeless tobacco: Not on file  . Alcohol Use: No    REPRODUCTIVE HISTORY AND PERSONAL RISK ASSESSMENT FACTORS: Menarche was at age 32.   Menopause: in her 29s Uterus Intact: yes Ovaries Intact: yes G2P2A0 , first live birth at age 76  She has not previously undergone treatment for infertility.   No OCP use   She has used HRT in the past - for approximately 15 years.    FAMILY HISTORY:  We obtained a detailed, 4-generation family history.  Significant diagnoses are listed below: Family History  Problem Relation Age of Onset  . Cancer Sister     breast and pancreatic  The patient was diagnosed with breast cancer at age 48.  She has eight sisters.  One was stillborn, a second was diagnosed with bilateral breast cancer at age 22, a third was diagnosed with breast cancer at ages 41 and 44, and a fourth was diagnosed with pancreatic cancer at age 23.  All of these sisters are deceased.  The remaining sisters are alive and well.  The patient has one niece with breast cancer under the age of 47.  This cancer started as a papaloma.  A  second niece was diagnosed with colon cancer under the age of 35.  The patient's mother is deceased but did not have  Cancer.  Her mother had three sisters and four brothers.  One sister was diagnosed with breast cancer at age 39, and an uncle died of an unknown cancer.  There is no other cancer history on the patient's maternal or paternal side of the family.  Patient's maternal ancestors are of [place] descent, and paternal ancestors are of English descent. There is no reported Ashkenazi Jewish ancestry. There is no known consanguinity.  GENETIC COUNSELING RISK ASSESSMENT, DISCUSSION, AND SUGGESTED FOLLOW UP: We reviewed the natural history and genetic etiology of sporadic, familial and hereditary cancer syndromes.  Approximately 5-10% of breast cancer is hereditary and 85% are the result of BRCA1 or 2 mutations.  We discussed the red flags of hereditary breast cancer, and the dominant inheritance patterns.  We also discussed pursuing a panel test, if BRCAnalysis came back negative, because of the significant history, and the niece with colon cancer.  The patient's personal and family history is suggestive of the following possible diagnosis: hereditary breast and ovarian cancer  We discussed that identification of a hereditary cancer syndrome may help her care providers tailor the patients medical management. If a mutation indicating a hereditary  cancer syndrome is detected in this case, the [employer] recommendations would include increased surveillance and possible prophylactic surgery. If a mutation is detected, the patient will be referred back to the referring provider and to any additional appropriate care providers to discuss the relevant options.   If a mutation is not found in the patient, this will decrease the likelihood of a hereditary cancer syndrome as the explanation for her  Personal and family history of cancer. Cancer surveillance options would be discussed  for the patient according to the appropriate standard National Comprehensive Cancer Network and American Cancer Society guidelines, with consideration of their personal and family history risk factors. In this case, the patient will be referred back to their care providers for discussions of management.   In order to estimate her chance of having a BRCA1 or BRCA2 mutation, we used statistical models (PENN II) and laboratory data that take into account her personal medical history, family history and ancestry.  Because each model is different, there can be a lot of variability in the risks they give.  Therefore, these numbers must be considered a rough range and not a precise risk of having a BRCA1 or BRCA2 mutation.  These models estimate that she has approximately a 22% chance of having a mutation.  More specifically, she has a 3% chance for having a BRCA1 mutation and a 19% chance of having a BRCA2 mutation. Based on this assessment of her family and personal history, genetic testing is recommended.  After considering the risks, benefits, and limitations, the patient provided informed consent for  the following  Testing:  BRCAnalysis through [employer] and [employer] through [employer]  The CancerNext panel will be placed on hold until the [facility] results are complete.   Per the patient's request, we will contact her by telephone to discuss these results. A follow up genetic counseling visit will be scheduled if indicated.  The patient was seen for a total of 45 minutes, greater than 50% of which was spent face-to-face counseling.  This plan is being carried out per Dr. Tenna Child recommendations.  This note will also be sent to the referring provider via the electronic medical record. The patient will be supplied with a summary of this genetic counseling discussion as well as educational information on the discussed hereditary cancer syndromes following the conclusion of their  visit.   Patient was discussed with Dr. Drue Second.  _______________________________________________________________________ For Office Staff:  Number of people involved in session: 2 Was an Intern/ student involved with case: not applicable

## 2011-09-16 ENCOUNTER — Encounter (HOSPITAL_BASED_OUTPATIENT_CLINIC_OR_DEPARTMENT_OTHER): Payer: Self-pay | Admitting: *Deleted

## 2011-09-16 NOTE — Progress Notes (Signed)
Pt was here for br bx-did well-to bring overnight bag-on no rx meds-no labs needed-still works full time

## 2011-09-19 ENCOUNTER — Encounter: Payer: Self-pay | Admitting: Genetic Counselor

## 2011-09-22 ENCOUNTER — Ambulatory Visit (HOSPITAL_COMMUNITY)
Admission: RE | Admit: 2011-09-22 | Discharge: 2011-09-22 | Disposition: A | Discharge status: Home / Self Care | Payer: Commercial Managed Care - PPO | Source: Ambulatory Visit | Attending: Surgery | Admitting: Surgery

## 2011-09-22 ENCOUNTER — Encounter (HOSPITAL_BASED_OUTPATIENT_CLINIC_OR_DEPARTMENT_OTHER)
Admission: RE | Disposition: A | Discharge status: Home / Self Care | Payer: Self-pay | Source: Ambulatory Visit | Attending: Surgery

## 2011-09-22 ENCOUNTER — Encounter (HOSPITAL_BASED_OUTPATIENT_CLINIC_OR_DEPARTMENT_OTHER): Payer: Self-pay | Admitting: Certified Registered Nurse Anesthetist

## 2011-09-22 ENCOUNTER — Encounter: Payer: Self-pay | Admitting: Genetic Counselor

## 2011-09-22 ENCOUNTER — Encounter (HOSPITAL_BASED_OUTPATIENT_CLINIC_OR_DEPARTMENT_OTHER): Payer: Self-pay

## 2011-09-22 ENCOUNTER — Ambulatory Visit (HOSPITAL_BASED_OUTPATIENT_CLINIC_OR_DEPARTMENT_OTHER): Payer: Commercial Managed Care - PPO | Admitting: Certified Registered Nurse Anesthetist

## 2011-09-22 ENCOUNTER — Ambulatory Visit (HOSPITAL_BASED_OUTPATIENT_CLINIC_OR_DEPARTMENT_OTHER)
Admission: RE | Admit: 2011-09-22 | Discharge: 2011-09-23 | Disposition: A | Discharge status: Home / Self Care | Payer: Commercial Managed Care - PPO | Source: Ambulatory Visit | Attending: Surgery | Admitting: Surgery

## 2011-09-22 ENCOUNTER — Telehealth (INDEPENDENT_AMBULATORY_CARE_PROVIDER_SITE_OTHER): Payer: Self-pay | Admitting: General Surgery

## 2011-09-22 DIAGNOSIS — D0590 Unspecified type of carcinoma in situ of unspecified breast: Secondary | ICD-10-CM | POA: Insufficient documentation

## 2011-09-22 DIAGNOSIS — C50119 Malignant neoplasm of central portion of unspecified female breast: Secondary | ICD-10-CM

## 2011-09-22 DIAGNOSIS — C50919 Malignant neoplasm of unspecified site of unspecified female breast: Secondary | ICD-10-CM

## 2011-09-22 HISTORY — PX: MASTECTOMY W/ SENTINEL NODE BIOPSY: SHX2001

## 2011-09-22 SURGERY — MASTECTOMY WITH SENTINEL LYMPH NODE BIOPSY
Anesthesia: General | Site: Breast | Laterality: Right | Wound class: Clean

## 2011-09-22 MED ORDER — METOCLOPRAMIDE HCL 5 MG/ML IJ SOLN
INTRAMUSCULAR | Status: DC | PRN
  Administered 2011-09-22: 10 mg via INTRAVENOUS

## 2011-09-22 MED ORDER — EPHEDRINE SULFATE 50 MG/ML IJ SOLN
INTRAMUSCULAR | Status: DC | PRN
  Administered 2011-09-22: 10 mg via INTRAVENOUS

## 2011-09-22 MED ORDER — LACTATED RINGERS IV SOLN
INTRAVENOUS | Status: DC
  Administered 2011-09-22 (×2): via INTRAVENOUS

## 2011-09-22 MED ORDER — FENTANYL CITRATE 0.05 MG/ML IJ SOLN
INTRAMUSCULAR | Status: DC | PRN
  Administered 2011-09-22: 25 ug via INTRAVENOUS
  Administered 2011-09-22 (×2): 50 ug via INTRAVENOUS
  Administered 2011-09-22 (×2): 25 ug via INTRAVENOUS

## 2011-09-22 MED ORDER — CHLORHEXIDINE GLUCONATE 4 % EX LIQD: 1.0000 "application " | Freq: Once | CUTANEOUS | Status: DC

## 2011-09-22 MED ORDER — OXYCODONE-ACETAMINOPHEN 5-325 MG PO TABS
1.0000 | ORAL_TABLET | ORAL | Status: DC | PRN
  Administered 2011-09-22 – 2011-09-23 (×2): 1 via ORAL

## 2011-09-22 MED ORDER — MIDAZOLAM HCL 2 MG/2ML IJ SOLN
0.5000 mg | INTRAMUSCULAR | Status: DC | PRN
  Administered 2011-09-22: 2 mg via INTRAVENOUS

## 2011-09-22 MED ORDER — ACETAMINOPHEN 10 MG/ML IV SOLN
1000.0000 mg | Freq: Once | INTRAVENOUS | Status: AC
  Administered 2011-09-22: 1000 mg via INTRAVENOUS

## 2011-09-22 MED ORDER — CEFAZOLIN SODIUM 1-5 GM-% IV SOLN
1.0000 g | INTRAVENOUS | Status: AC
  Administered 2011-09-22: 1 g via INTRAVENOUS

## 2011-09-22 MED ORDER — METOCLOPRAMIDE HCL 5 MG/ML IJ SOLN: 10.0000 mg | Freq: Once | INTRAMUSCULAR | Status: AC | PRN

## 2011-09-22 MED ORDER — PROPOFOL 10 MG/ML IV EMUL
INTRAVENOUS | Status: DC | PRN
  Administered 2011-09-22: 200 mg via INTRAVENOUS

## 2011-09-22 MED ORDER — HYDROMORPHONE HCL PF 1 MG/ML IJ SOLN: 2.0000 mg | INTRAMUSCULAR | Status: DC | PRN

## 2011-09-22 MED ORDER — ONDANSETRON HCL 4 MG PO TABS: 4.0000 mg | ORAL_TABLET | Freq: Four times a day (QID) | ORAL | Status: DC | PRN

## 2011-09-22 MED ORDER — HYDROMORPHONE HCL PF 1 MG/ML IJ SOLN
0.2500 mg | INTRAMUSCULAR | Status: DC | PRN
  Administered 2011-09-22 (×2): 0.25 mg via INTRAVENOUS

## 2011-09-22 MED ORDER — FENTANYL CITRATE 0.05 MG/ML IJ SOLN
50.0000 ug | INTRAMUSCULAR | Status: DC | PRN
  Administered 2011-09-22: 100 ug via INTRAVENOUS

## 2011-09-22 MED ORDER — LIDOCAINE HCL (CARDIAC) 20 MG/ML IV SOLN
INTRAVENOUS | Status: DC | PRN
  Administered 2011-09-22: 60 mg via INTRAVENOUS

## 2011-09-22 MED ORDER — ONDANSETRON HCL 4 MG/2ML IJ SOLN
4.0000 mg | Freq: Four times a day (QID) | INTRAMUSCULAR | Status: DC | PRN
  Administered 2011-09-22 – 2011-09-23 (×3): 4 mg via INTRAVENOUS

## 2011-09-22 MED ORDER — DEXTROSE IN LACTATED RINGERS 5 % IV SOLN
INTRAVENOUS | Status: DC
  Administered 2011-09-22 – 2011-09-23 (×2): via INTRAVENOUS

## 2011-09-22 MED ORDER — SODIUM CHLORIDE 0.9 % IJ SOLN
INTRAMUSCULAR | Status: DC | PRN
  Administered 2011-09-22: 10:00:00 via INTRAMUSCULAR

## 2011-09-22 MED ORDER — DEXAMETHASONE SODIUM PHOSPHATE 4 MG/ML IJ SOLN
INTRAMUSCULAR | Status: DC | PRN
  Administered 2011-09-22: 4 mg via INTRAVENOUS

## 2011-09-22 MED ORDER — ONDANSETRON HCL 4 MG/2ML IJ SOLN
INTRAMUSCULAR | Status: DC | PRN
  Administered 2011-09-22: 4 mg via INTRAVENOUS

## 2011-09-22 SURGICAL SUPPLY — 63 items
ADH SKN CLS APL DERMABOND .7 (GAUZE/BANDAGES/DRESSINGS) ×1
APPLIER CLIP 11 MED OPEN (CLIP)
APPLIER CLIP 9.375 MED OPEN (MISCELLANEOUS)
APR CLP MED 11 20 MLT OPN (CLIP)
APR CLP MED 9.3 20 MLT OPN (MISCELLANEOUS)
BANDAGE ELASTIC 6 VELCRO ST LF (GAUZE/BANDAGES/DRESSINGS) ×2 IMPLANT
BINDER BREAST LRG (GAUZE/BANDAGES/DRESSINGS) ×1 IMPLANT
BIOPATCH RED 1 DISK 7.0 (GAUZE/BANDAGES/DRESSINGS) ×2 IMPLANT
BLADE HEX COATED 2.75 (ELECTRODE) ×2 IMPLANT
BLADE SURG 10 STRL SS (BLADE) ×2 IMPLANT
BLADE SURG 15 STRL LF DISP TIS (BLADE) ×1 IMPLANT
BLADE SURG 15 STRL SS (BLADE) ×2
BLADE SURG ROTATE 9660 (MISCELLANEOUS) IMPLANT
CANISTER SUCTION 1200CC (MISCELLANEOUS) ×2 IMPLANT
CHLORAPREP W/TINT 26ML (MISCELLANEOUS) ×2 IMPLANT
CLIP APPLIE 11 MED OPEN (CLIP) IMPLANT
CLIP APPLIE 9.375 MED OPEN (MISCELLANEOUS) IMPLANT
CLOTH BEACON ORANGE TIMEOUT ST (SAFETY) ×2 IMPLANT
COVER MAYO STAND STRL (DRAPES) ×2 IMPLANT
COVER PROBE W GEL 5X96 (DRAPES) ×2 IMPLANT
COVER TABLE BACK 60X90 (DRAPES) ×2 IMPLANT
DECANTER SPIKE VIAL GLASS SM (MISCELLANEOUS) IMPLANT
DERMABOND ADVANCED (GAUZE/BANDAGES/DRESSINGS) ×1
DERMABOND ADVANCED .7 DNX12 (GAUZE/BANDAGES/DRESSINGS) ×1 IMPLANT
DRAIN CHANNEL 19F RND (DRAIN) ×2 IMPLANT
DRAPE LAPAROSCOPIC ABDOMINAL (DRAPES) ×1 IMPLANT
DRAPE U-SHAPE 76X120 STRL (DRAPES) IMPLANT
DRAPE UTILITY XL STRL (DRAPES) ×2 IMPLANT
DRSG PAD ABDOMINAL 8X10 ST (GAUZE/BANDAGES/DRESSINGS) ×1 IMPLANT
DRSG TEGADERM 2-3/8X2-3/4 SM (GAUZE/BANDAGES/DRESSINGS) ×2 IMPLANT
ELECT BLADE 4.0 EZ CLEAN MEGAD (MISCELLANEOUS) ×2
ELECT REM PT RETURN 9FT ADLT (ELECTROSURGICAL) ×2
ELECTRODE BLDE 4.0 EZ CLN MEGD (MISCELLANEOUS) ×1 IMPLANT
ELECTRODE REM PT RTRN 9FT ADLT (ELECTROSURGICAL) ×1 IMPLANT
EVACUATOR SILICONE 100CC (DRAIN) ×2 IMPLANT
GLOVE ECLIPSE 6.5 STRL STRAW (GLOVE) ×1 IMPLANT
GLOVE EUDERMIC 7 POWDERFREE (GLOVE) ×2 IMPLANT
GOWN PREVENTION PLUS XLARGE (GOWN DISPOSABLE) ×4 IMPLANT
NDL HYPO 25X1 1.5 SAFETY (NEEDLE) ×2 IMPLANT
NDL SAFETY ECLIPSE 18X1.5 (NEEDLE) ×1 IMPLANT
NEEDLE HYPO 18GX1.5 SHARP (NEEDLE) ×2
NEEDLE HYPO 25X1 1.5 SAFETY (NEEDLE) ×4 IMPLANT
NS IRRIG 1000ML POUR BTL (IV SOLUTION) ×2 IMPLANT
PACK BASIN DAY SURGERY FS (CUSTOM PROCEDURE TRAY) ×2 IMPLANT
PEN SKIN MARKING BROAD TIP (MISCELLANEOUS) ×2 IMPLANT
PENCIL BUTTON HOLSTER BLD 10FT (ELECTRODE) ×2 IMPLANT
PIN SAFETY STERILE (MISCELLANEOUS) ×2 IMPLANT
SHEET MEDIUM DRAPE 40X70 STRL (DRAPES) ×1 IMPLANT
SLEEVE SCD COMPRESS KNEE MED (MISCELLANEOUS) ×2 IMPLANT
SPONGE GAUZE 4X4 12PLY (GAUZE/BANDAGES/DRESSINGS) ×2 IMPLANT
SPONGE LAP 18X18 X RAY DECT (DISPOSABLE) ×2 IMPLANT
SPONGE LAP 4X18 X RAY DECT (DISPOSABLE) ×2 IMPLANT
STAPLER VISISTAT 35W (STAPLE) ×2 IMPLANT
SUT ETHILON 2 0 FS 18 (SUTURE) ×1 IMPLANT
SUT MNCRL AB 4-0 PS2 18 (SUTURE) ×1 IMPLANT
SUT SILK 2 0 SH (SUTURE) IMPLANT
SUT VICRYL 3-0 CR8 SH (SUTURE) ×3 IMPLANT
SYR CONTROL 10ML LL (SYRINGE) ×4 IMPLANT
TOWEL OR 17X24 6PK STRL BLUE (TOWEL DISPOSABLE) ×3 IMPLANT
TOWEL OR NON WOVEN STRL DISP B (DISPOSABLE) ×2 IMPLANT
TUBE CONNECTING 20X1/4 (TUBING) ×2 IMPLANT
WATER STERILE IRR 1000ML POUR (IV SOLUTION) ×1 IMPLANT
YANKAUER SUCT BULB TIP NO VENT (SUCTIONS) ×2 IMPLANT

## 2011-09-22 NOTE — Anesthesia Preprocedure Evaluation (Signed)
Anesthesia Evaluation  Patient identified by MRN, date of birth, ID band Patient awake    Reviewed: Allergy & Precautions, H&P , NPO status , Patient's Chart, lab work & pertinent test results, reviewed documented beta blocker date and time   Airway Mallampati: II TM Distance: >3 FB Neck ROM: full    Dental   Pulmonary neg pulmonary ROS,          Cardiovascular negative cardio ROS      Neuro/Psych  Headaches, negative psych ROS   GI/Hepatic negative GI ROS, Neg liver ROS,   Endo/Other  negative endocrine ROS  Renal/GU negative Renal ROS  negative genitourinary   Musculoskeletal   Abdominal   Peds  Hematology negative hematology ROS (+)   Anesthesia Other Findings See surgeon's H&P   Reproductive/Obstetrics negative OB ROS                           Anesthesia Physical Anesthesia Plan  ASA: II  Anesthesia Plan: General   Post-op Pain Management:    Induction: Intravenous  Airway Management Planned: LMA  Additional Equipment:   Intra-op Plan:   Post-operative Plan: Extubation in OR  Informed Consent: I have reviewed the patients History and Physical, chart, labs and discussed the procedure including the risks, benefits and alternatives for the proposed anesthesia with the patient or authorized representative who has indicated his/her understanding and acceptance.     Plan Discussed with: CRNA and Surgeon  Anesthesia Plan Comments:         Anesthesia Quick Evaluation

## 2011-09-22 NOTE — Anesthesia Procedure Notes (Signed)
Procedure Name: LMA Insertion Date/Time: 09/22/2011 9:34 AM Performed by: Paige Monarrez D Pre-anesthesia Checklist: Patient identified, Emergency Drugs available, Suction available and Patient being monitored Patient Re-evaluated:Patient Re-evaluated prior to inductionOxygen Delivery Method: Circle System Utilized Preoxygenation: Pre-oxygenation with 100% oxygen Intubation Type: IV induction Ventilation: Mask ventilation without difficulty LMA: LMA inserted LMA Size: 4.0 Number of attempts: 1 Placement Confirmation: positive ETCO2 Tube secured with: Tape Dental Injury: Teeth and Oropharynx as per pre-operative assessment

## 2011-09-22 NOTE — Progress Notes (Signed)
Emotional support to patient during breast injections 

## 2011-09-22 NOTE — Transfer of Care (Signed)
Immediate Anesthesia Transfer of Care Note  Patient: Darlene Day  Procedure(s) Performed: Procedure(s) (LRB): MASTECTOMY WITH SENTINEL LYMPH NODE BIOPSY (Right)  Patient Location: PACU  Anesthesia Type: General  Level of Consciousness: awake, alert , oriented and patient cooperative  Airway & Oxygen Therapy: Patient Spontanous Breathing and Patient connected to face mask oxygen  Post-op Assessment: Report given to PACU RN and Post -op Vital signs reviewed and stable  Post vital signs: Reviewed and stable  Complications: No apparent anesthesia complications

## 2011-09-22 NOTE — Progress Notes (Signed)
New drainage on left breast marked; old drainage on right breast unchanged.  Bil breasts soft at upper margins; no redness or welps on any margins; pt does continue to complain of itching

## 2011-09-22 NOTE — H&P (Signed)
  Darlene Day DOB: 02-25-1941 MRN: [id number]                                                                                      DATE: 09/01/2011  PCP: Pearson Grippe, MD, MD Referring Provider: No ref. 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  female who presents for Surgery for a multifocal right breast cancer DCIS with suspicion for invasive disease by MRI scan. This was diagnosed on a surgical biopsy done several weeks ago. After review her situation she decided to proceed to mastectomy. PMH:  has a past medical history of Papilloma of breast; No pertinent past medical history; and Headache.  PSH:   has past surgical history that includes Tubal ligation (1978); Colonoscopy; Cardiac catheterization (2005); and Breast surgery (3/13).  ALLERGIES:   Allergies  Allergen Reactions  . Codeine Nausea And Vomiting    MEDICATIONS: Current facility-administered medications:acetaminophen (OFIRMEV) IV 1,000 mg, 1,000 mg, Intravenous, Once, Hart Robinsons, MD, 1,000 mg at 09/22/11 0847;  ceFAZolin (ANCEF) IVPB 1 g/50 mL premix, 1 g, Intravenous, 60 min Pre-Op, Currie Paris, MD;  chlorhexidine (HIBICLENS) 4 % liquid 1 application, 1 application, Topical, Once, Currie Paris, MD chlorhexidine (HIBICLENS) 4 % liquid 1 application, 1 application, Topical, Once, Currie Paris, MD;  fentaNYL (SUBLIMAZE) injection 50-100 mcg, 50-100 mcg, Intravenous, PRN, Hart Robinsons, MD;  lactated ringers infusion, , Intravenous, Continuous, Bedelia Person, MD, Last Rate: 20 mL/hr at 09/22/11 0848;  midazolam (VERSED) injection 0.5-2 mg, 0.5-2 mg, Intravenous, PRN, Hart Robinsons, MD  ROS: No change from office visit of a few weeks ago  EXAM:   GENERAL:  The patient is alert, oriented, and  generally healthy-appearing, NAD. Mood and affect are normal.  HEENT:  The head is normocephalic, the eyes nonicteric, the pupils were round regular and equal. EOMs are normal. Pharynx normal. Dentition good.  NECK:  The neck is supple and there are no masses or thyromegaly.  LUNGS: Normal respirations and clear to auscultation.  HEART: Regular rhythm, with no murmurs rubs or gallops. Pulses are intact carotid dorsalis pedis and posterior tibial. No significant varicosities are noted.  BREASTS:  The breasts are symmetric in appearance. There are minor postsurgical changes on the right. No other abnormalities are noted.  ABDOMEN: Soft, flat, and nontender. No masses or organomegaly is noted. No hernias are noted. Bowel sounds are normal.  EXTREMITIES:  Good range of motion, no edema.   DATA REVIEWED:  I reviewed the pathology report again as well as the previous office notes.    Rethel Sebek J 09/22/2011  CC: No ref. provider found, Pearson Grippe, MD, MD

## 2011-09-22 NOTE — Op Note (Signed)
LARESSA BOLINGER 1940-07-18 [id number] 09/01/2011  Preoperative diagnosis: Right breast cancer, DCIS, central, clinical stage 0  Postoperative diagnosis: Same  Procedure: Right total mastectomy with the dye injection and axillary sentinel lymph node excision  Surgeon: Currie Paris   Assistant surgeon: None  Anesthesia:General  Clinical History and Indications:The patient is seen in the holding area and we reviewed the plans for the procedure as noted above. We reviewed the risks and complications a final time. She had no further questions. I marked theright side as the operative side.  Description of Procedure: The patient was taken to the operating room. After satisfactory general anesthesia was obtained the timeout was done.   I then injected 5 cc of dilute methylene blue and injected it subareaorly and massaged it in. A full prep and drape was then done.  I outlined an elliptical incision and marked the inframammary fold and midline. The incision was made. The usual skin flaps were raised. I went to the clavicle superiorly and sternum medially and towards the latissimus laterally.    After the superior flap was complete I was able to use the neoprobe to locate a sentinel node.  I found a total of 3 nodes.  Once the nodes were removed they were  forwarded to pathology.   The inferior flap was then made going to the inframammary fold and out to the latissimus. The breast was then removed from the pectoralis starting medially and working laterally. When I got to the lateral aspect of the pectoralis major muscle I opened the clavipectoral fascia. I finished removing the breast from the serratus muscles.In doing so I found a blue lymph node that was really an intramammary lymph node and sent this off as the fourth sentinel lymph node. I then irrigated and made sure everything was dry. I placed a 19 Blake drain. A second irrigation check for hemostasis was made and incision was closed  with interrupted 3-0 Vicryl, 4-0 Monocryl subcuticular.  At this point the pathologist reported on the sentinel node and that it was negative. .  The patient tolerated the procedure well. There were no operative complications. All counts were correct. Estimated blood loss was About 50 cc. Sterile dressings were applied and the patient taken to the PACU in satisfactory condition.  Currie Paris, MD, FACS 09/22/2011 11:02 AM

## 2011-09-22 NOTE — Anesthesia Postprocedure Evaluation (Signed)
Anesthesia Post Note  Patient: Darlene Day  Procedure(s) Performed: Procedure(s) (LRB): MASTECTOMY WITH SENTINEL LYMPH NODE BIOPSY (Right)  Anesthesia type: General  Patient location: PACU  Post pain: Pain level controlled  Post assessment: Patient's Cardiovascular Status Stable  Last Vitals:  Filed Vitals:   09/22/11 1330  BP: 137/70  Pulse: 83  Temp: 36.3 C  Resp: 16    Post vital signs: Reviewed and stable  Level of consciousness: alert  Complications: No apparent anesthesia complications

## 2011-09-22 NOTE — Telephone Encounter (Signed)
Message copied by Liliana Cline on Mon Sep 22, 2011  2:45 PM ------      Message from: Currie Paris      Created: Mon Sep 22, 2011 11:06 AM       She will need a medical oncology consultation. I don't think we have put one in yet.

## 2011-09-23 ENCOUNTER — Encounter (HOSPITAL_BASED_OUTPATIENT_CLINIC_OR_DEPARTMENT_OTHER): Payer: Self-pay | Admitting: Surgery

## 2011-09-23 MED ORDER — HYDROCODONE-ACETAMINOPHEN 5-325 MG PO TABS: 1.0000 | ORAL_TABLET | ORAL | Status: AC | PRN

## 2011-09-23 MED ORDER — PROMETHAZINE HCL 25 MG PO TABS: 25.0000 mg | ORAL_TABLET | Freq: Four times a day (QID) | ORAL | Status: DC | PRN

## 2011-09-23 NOTE — Discharge Instructions (Signed)
[facility] [phone]  MASTECTOMY: POST OP INSTRUCTIONS  Always review your discharge instruction sheet given to you by the facility where your surgery was performed. IF YOU HAVE DISABILITY OR FAMILY LEAVE FORMS, YOU MUST BRING THEM TO THE OFFICE FOR PROCESSING.   DO NOT GIVE THEM TO YOUR DOCTOR. A prescription for pain medication may be given to you upon discharge.  Take your pain medication as prescribed, as needed.  If narcotic pain medicine is not needed, then you may take  ibuprofen (Advil) as needed. 1. Take your usually prescribed medications unless otherwise directed. 2. If you need a refill on your pain medication, please contact your pharmacy.  They will contact our office to request authorization.  Prescriptions will not be filled after 5pm or on week-ends. 3. You may resume your normal diet at home. 4. Most patients will experience some swelling and bruising on the chest and underarm.  Ice packs will help.  Swelling and bruising can take several days to resolve.  5. It is common to experience some constipation if taking pain medication after surgery.  Increasing fluid intake and taking a stool softener (such as Colace) will usually help or prevent this problem from occurring.  A mild laxative (Milk of Magnesia or Miralax) should be taken according to package instructions if there are no bowel movements after 48 hours. 6. Leave your dressing on until your visit to the office, You may re-adjust or remove the binder as needed. You may take a limited sponge bath.  No tub baths or showers until the drains are removed.  You may have steri-strips (small skin tapes) in place directly over the incision.  These strips should be left on the skin for 7-10 days.  If your surgeon used skin glue on the incision, the glue will flake off over the next 2-3 weeks.  Any sutures or staples will be removed at the office during your follow-up visits. 7. DRAINS:  It is important to keep  a list of the amount of drainage produced each day in your drains.  Before leaving the hospital, you should be instructed on drain care.  Call our office if you have any questions about your drains. BE SURE TO BRING THE RECORD OF THE AMOUNT OF DRAINAGE TO YOUR OFFICE VISITS. We use this to determine when the drains can be removed. 8. ACTIVITIES:  You may resume regular (light) daily activities beginning the next day--such as daily self-care, walking, climbing stairs--gradually increasing activities as tolerated.  You may have sexual intercourse when it is comfortable.  Refrain from any heavy lifting or straining until approved by your doctor. a. You may drive when you are no longer taking prescription pain medication, you can comfortably wear a seatbelt, and you can safely maneuver your car and apply brakes. b. RETURN TO WORK:  We will discuss this when you are in the office and we can see how you are progressing 9. You should see your doctor in the office for a follow-up appointment approximately 7 days after your surgery.  Your doctor's nurse will typically make your follow-up appointment when she calls you with your pathology report.  Expect your pathology report 2-3 business days after your surgery.  You may call to check if you do not hear from [place] after three days.   10. OTHER INSTRUCTIONS: ______________________________________________________________________________________________ ____________________________________________________________________________________________ WHEN TO CALL YOUR DOCTOR: 1. Fever over 101.0 2. Nausea and/or vomiting 3. Extreme swelling or bruising 4. Continued bleeding from incision or the  drains. 5. Increased pain, redness, or drainage from the incision. The clinic staff is available to answer your questions during regular business hours.  Please don't hesitate to call and ask to speak to one of the nurses for clinical concerns.  If you have a medical emergency, go to  the nearest emergency room or call 911.  A surgeon from [facility] is always on call at the hospital. [address], [address], [place], [place]  [address] ? [address] [address], [place], [place]   [address] [phone] ? [phone] ? FAX [phone] Web site: [email or web address]

## 2011-09-23 NOTE — Progress Notes (Signed)
<  principal problem not specified>  Subjective: Mild "queasy" but otherwise OK. Minimal pain.  Objective: Vital signs in last 24 hours: Temp:  [97.4 F (36.3 C)-98.4 F (36.9 C)] 98.2 F (36.8 C) (04/23 0700) Pulse Rate:  [65-92] 88  (04/23 0700) Resp:  [13-21] 16  (04/23 0700) BP: (116-150)/(54-81) 132/75 mmHg (04/23 0700) SpO2:  [95 %-100 %] 99 % (04/23 0700)    Intake/Output from previous day: 04/22 0701 - 04/23 0700 In: 3328.3 [P.O.:1104; I.V.:2151.3] Out: 810 [Urine:700; Emesis/NG output:50; Drains:60] Intake/Output this shift:    General appearance: alert, cooperative and no distress Incision/Wound:Wound clean, JP thin  Lab Results:  No results found for this or any previous visit (from the past 24 hour(s)).   Studies/Results Radiology     MEDS, Scheduled    . acetaminophen  1,000 mg Intravenous Once  .  ceFAZolin (ANCEF) IV  1 g Intravenous 60 min Pre-Op  . DISCONTD: chlorhexidine  1 application Topical Once  . DISCONTD: chlorhexidine  1 application Topical Once     Assessment: <principal problem not specified> Doing well and able to go home  Plan: Discharge  LOS: 1 day    Currie Paris, MD, [facility], [place] [phone]   09/23/2011 7:58 AM

## 2011-09-24 ENCOUNTER — Telehealth (INDEPENDENT_AMBULATORY_CARE_PROVIDER_SITE_OTHER): Payer: Self-pay | Admitting: General Surgery

## 2011-09-24 ENCOUNTER — Telehealth: Payer: Self-pay | Admitting: *Deleted

## 2011-09-24 NOTE — Telephone Encounter (Signed)
Patient aware path results are good. Lymph nodes negative and margins ok. She will follow up in the office at her scheduled appt and call with any questions prior.  

## 2011-09-24 NOTE — Telephone Encounter (Signed)
Message copied by Liliana Cline on Wed Sep 24, 2011  2:36 PM ------      Message from: Currie Paris      Created: Wed Sep 24, 2011 12:54 PM       Tell the patient that her margins are OK and her lymph nodes are negative. I will discuss in detail in the office.

## 2011-09-24 NOTE — Telephone Encounter (Signed)
Confirmed 10/02/11 appt w/ pt. Mailed before appt letter & packet to pt.  Took paperwork to [facility].  Emailed Jade at [facility] to make her aware of appt.

## 2011-09-29 ENCOUNTER — Other Ambulatory Visit: Payer: Self-pay | Admitting: *Deleted

## 2011-09-29 DIAGNOSIS — C50119 Malignant neoplasm of central portion of unspecified female breast: Secondary | ICD-10-CM

## 2011-09-30 ENCOUNTER — Ambulatory Visit (INDEPENDENT_AMBULATORY_CARE_PROVIDER_SITE_OTHER): Payer: Commercial Managed Care - PPO | Admitting: Surgery

## 2011-09-30 ENCOUNTER — Encounter (INDEPENDENT_AMBULATORY_CARE_PROVIDER_SITE_OTHER): Payer: Self-pay | Admitting: Surgery

## 2011-09-30 VITALS — BP 160/82 | HR 82 | Temp 99.4°F | Resp 16 | Ht 70.0 in | Wt 147.5 lb

## 2011-09-30 DIAGNOSIS — Z9889 Other specified postprocedural states: Secondary | ICD-10-CM

## 2011-09-30 NOTE — Patient Instructions (Signed)
Call Jade at my office to see about getting your drain out when is has slowed down some more

## 2011-09-30 NOTE — Progress Notes (Signed)
Darlene Day    [id number] 09/30/2011    1941/01/21   CC: Post op Mastectomy  HPI: The patient returns for post op follow-up. She underwent a total mastectomy and SLN  on 4/22. Over all she feels that she is doing well. No particular problems  PE: The incision is healing nicely and there is no evidence of infection or hematoma.  The drains are still >30cc/day.  DATA REVIEWED: Pathology report showed residual DCIS, no invasion, negative nodes  IMPRESSION: Patient doing well.   PLAN: Her next visit will be in a few days when the drain slows. Gave her a copy of the path report.

## 2011-10-02 ENCOUNTER — Ambulatory Visit (HOSPITAL_BASED_OUTPATIENT_CLINIC_OR_DEPARTMENT_OTHER): Payer: Commercial Managed Care - PPO | Admitting: Oncology

## 2011-10-02 ENCOUNTER — Other Ambulatory Visit (HOSPITAL_BASED_OUTPATIENT_CLINIC_OR_DEPARTMENT_OTHER): Payer: Commercial Managed Care - PPO | Admitting: Lab

## 2011-10-02 ENCOUNTER — Ambulatory Visit (HOSPITAL_BASED_OUTPATIENT_CLINIC_OR_DEPARTMENT_OTHER): Payer: Commercial Managed Care - PPO

## 2011-10-02 ENCOUNTER — Telehealth: Payer: Self-pay | Admitting: Oncology

## 2011-10-02 ENCOUNTER — Encounter: Payer: Self-pay | Admitting: Oncology

## 2011-10-02 VITALS — BP 148/84 | HR 77 | Temp 98.2°F | Ht 70.0 in | Wt 147.6 lb

## 2011-10-02 DIAGNOSIS — C50119 Malignant neoplasm of central portion of unspecified female breast: Secondary | ICD-10-CM

## 2011-10-02 DIAGNOSIS — D0590 Unspecified type of carcinoma in situ of unspecified breast: Secondary | ICD-10-CM

## 2011-10-02 DIAGNOSIS — Z803 Family history of malignant neoplasm of breast: Secondary | ICD-10-CM

## 2011-10-02 DIAGNOSIS — Z800 Family history of malignant neoplasm of digestive organs: Secondary | ICD-10-CM

## 2011-10-02 DIAGNOSIS — Z171 Estrogen receptor negative status [ER-]: Secondary | ICD-10-CM

## 2011-10-02 LAB — CBC WITH DIFFERENTIAL/PLATELET
EOS%: 4.3 % (ref 0.0–7.0)
Eosinophils Absolute: 0.2 10*3/uL (ref 0.0–0.5)
LYMPH%: 19.6 % (ref 14.0–49.7)
MCH: 26.7 pg (ref 25.1–34.0)
MCHC: 32.7 g/dL (ref 31.5–36.0)
MCV: 81.8 fL (ref 79.5–101.0)
MONO%: 10 % (ref 0.0–14.0)
Platelets: 182 10*3/uL (ref 145–400)
RBC: 5.01 10*6/uL (ref 3.70–5.45)
RDW: 13.5 % (ref 11.2–14.5)

## 2011-10-02 LAB — COMPREHENSIVE METABOLIC PANEL
AST: 17 U/L (ref 0–37)
Albumin: 3.9 g/dL (ref 3.5–5.2)
Alkaline Phosphatase: 54 U/L (ref 39–117)
BUN: 16 mg/dL (ref 6–23)
Glucose, Bld: 88 mg/dL (ref 70–99)
Potassium: 4.1 mEq/L (ref 3.5–5.3)
Sodium: 143 mEq/L (ref 135–145)
Total Bilirubin: 0.6 mg/dL (ref 0.3–1.2)
Total Protein: 6.8 g/dL (ref 6.0–8.3)

## 2011-10-02 NOTE — Telephone Encounter (Signed)
gve the pt her nov 2013 appt calendar °

## 2011-10-02 NOTE — Patient Instructions (Signed)
1. Return to see me in 6 months

## 2011-10-02 NOTE — Progress Notes (Signed)
LEGACY CARRENDER [id number] 1940-08-04 71 y.o. 10/02/2011 3:29 PM  CC  Pearson Grippe, MD, MD [place] [address] [place], [address] [facility] [place] [address] Dr. 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 Patient is seen in medical oncology for discussion of treatment options  STAGE:  DCIS stage 0  REFERRING PHYSICIAN: Dr. Calton Dach  HISTORY OF PRESENT ILLNESS:  Darlene Day is a 71 y.o. female.  Significant other medical problems she is currently on no other medications. Patient began having screening mammograms in her 92s. Her family history is significant for 2 sisters with breast cancer, aunt with breast cancer one sister had pancreatic cancer. Cancer. Patient in March 2013 felt a right-sided mass. She went on to have nonstick mammogram performed that revealed heterogeneously dense and nodular parenchyma there was noted to be a 1 cm mass in the right breast in the 6:00 location. No suspicious calcifications or architectural distortion. Patient went on to have an ultrasound that showed a mass in the right breast 6 at the 6:00 position 1 cm from the nipple containing fluids and internal mural solid nodule. The mass measured 1.1 x 1.0 x 0.8 cm. 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 Cyndia Bent she had MRI of the breasts performed on 08/28/2011 the MRI showed foci of nonspecific enhancement bilaterally. There was noted to be an irregular enhancing mass in the 12:00 position of the right breast middle third clumped in linear enhancement was seen extending anteriorly from the mass along this. Margin of the biopsy cavity the abnormal area measured 3.2 x 2.3 x 1.9 cm. No other suspicious enhancement was noted.  Because of this patient went on to have a mastectomy on 09/22/2011. The final pathology revealed high-grade ductal carcinoma with 2 foci one measuring 3.0 cm and a second focus measuring 0.6 cm. The tumor was estrogen receptor negative at 0% progesterone receptor negative at 0%. 4 sentinel nodes were negative for metastatic disease. Patient is now seen in medical oncology for discussion of treatment options.She  Is without any complaints   Past Medical History: Past Medical History  Diagnosis Date  . Papilloma of breast     right breast  . No pertinent past medical history   . Headache     hx    Past Surgical History: Past Surgical History  Procedure Date  . Tubal ligation 1978  . Colonoscopy   . Cardiac catheterization 2005    normal  . Breast surgery 3/13    rt br bx  . Mastectomy w/ sentinel node biopsy 09/22/2011    Procedure: MASTECTOMY WITH SENTINEL LYMPH NODE BIOPSY;  Surgeon: Currie Paris, MD;  Location: [facility];  Service: General;  Laterality: Right;  Right mastectomy and removal of sentinel nodes    Family History: Family History  Problem Relation Age of Onset  . Cancer Sister     breast and pancreatic    Social History History  Substance Use Topics  . Smoking status: Never Smoker   . Smokeless tobacco: Never Used  . Alcohol Use: No    Allergies: Allergies  Allergen Reactions  . Codeine Nausea And Vomiting    Current Medications: Current Outpatient Prescriptions  Medication Sig Dispense Refill  . ALPRAZolam Prudy Feeler)  0.5 MG tablet Take 0.5 mg by mouth at bedtime as needed. Patient takes 1/2 tablet prn per dosage.      Marland Kitchen aspirin 325 MG EC tablet Take 325 mg by mouth daily.      . Calcium Citrate-Vitamin D (CITRACAL PETITES/VITAMIN D PO) Take 400 mg by mouth daily.      . Cholecalciferol (VITAMIN D-3 PO) Take 2,000 Units by mouth 2 (two) times daily.      . Cinnamon 500 MG capsule Take 500 mg by mouth daily.      Marland Kitchen ibandronate  (BONIVA) 150 MG tablet Take 150 mg by mouth every 30 (thirty) days. Take in the morning with a full glass of water, on an empty stomach, and do not take anything else by mouth or lie down for the next 30 min.      Marland Kitchen ibuprofen (ADVIL,MOTRIN) 200 MG tablet Take 200 mg by mouth every 6 (six) hours as needed.      Marland Kitchen Lysine 500 MG CAPS Take by mouth.      . Psyllium (METAMUCIL PO) Take by mouth daily.      . vitamin C (ASCORBIC ACID) 500 MG tablet Take 500 mg by mouth daily.      Marland Kitchen HYDROcodone-acetaminophen (NORCO) 5-325 MG per tablet Take 1 tablet by mouth every 4 (four) hours as needed for pain.  30 tablet  0  . promethazine (PHENERGAN) 25 MG tablet Take 1 tablet (25 mg total) by mouth every 6 (six) hours as needed for nausea.  10 tablet  0    OB/GYN History:Menarche at age 71 menopause in her 70s she has been on hormone replacement therapy for about 21 years she stopped at the time of her diagnosis. She's had 2 full term pregnancies her first mammograms were in her 29s.  ECOG PERFORMANCE STATUS: 0 - Asymptomatic  Genetic Counseling/testing: Has been seen by our genetic counselor and has had comprehensive testing for both BRCA1 and 2 mutations. They were negative. Bart testing was recommended but at this time she has declined  REVIEW OF SYSTEMS:  Constitutional: negative Ears, nose, mouth, throat, and face: negative Respiratory: negative Cardiovascular: negative Gastrointestinal: negative Genitourinary:negative Integument/breast: positive for right mastectomy  scar is healing she does still have a  JP drain in Musculoskeletal:negative Neurological: negative  PHYSICAL EXAMINATION: Blood pressure 148/84, pulse 77, temperature 98.2 F (36.8 C), temperature source Oral, height 5\' 10"  (1.778 m), weight 147 lb 9.6 oz (66.951 kg).  XBJ:YNWGN, healthy, no distress, well nourished and well developed SKIN: skin color, texture, turgor are normal HEAD: Normocephalic EYES: PERRLA, EOMI, Conjunctiva  are pink and non-injected, sclera clear EARS: External ears normal OROPHARYNX:no exudate, no erythema and lips, buccal mucosa, and tongue normal  NECK: supple, no adenopathy LYMPH:  no palpable lymphadenopathy, no hepatosplenomegaly BREAST:surgical scars noted on the right side and is healing well with the JP drain LUNGS: clear to auscultation and percussion HEART: regular rate & rhythm ABDOMEN:abdomen soft, non-tender, normal bowel sounds and no masses or organomegaly BACK: Back symmetric, no curvature., No CVA tenderness EXTREMITIES:no edema, no clubbing, no cyanosis  NEURO: alert & oriented x 3 with fluent speech, no focal motor/sensory deficits, gait normal  STUDIES/RESULTS: Nm Sentinel Node Inj-no Rpt (breast)  09/30/2011  Sulfur colloid was injected intradermally by the nuclear medicine technologist for breast cancer sentinel node localization.  Original Report Authenticated By: Sharyn Lull     LABS:    Chemistry   No results found for this basename: NA, K, CL, CO2,  BUN, CREATININE, GLU   No results found for this basename: CALCIUM, ALKPHOS, AST, ALT, BILITOT      Lab Results  Component Value Date   WBC 5.4 10/02/2011   HGB 13.4 10/02/2011   HCT 41.0 10/02/2011   MCV 81.8 10/02/2011   PLT 182 10/02/2011       PATHOLOGY: ADDITIONAL INFORMATION: 5. PROGNOSTIC INDICATORS - ACIS Results IMMUNOHISTOCHEMICAL AND MORPHOMETRIC ANALYSIS BY THE AUTOMATED CELLULAR IMAGING SYSTEM (ACIS) Estrogen Receptor (Negative, <1%): 0%, NEGATIVE Progesterone Receptor (Negative, <1%): 0%, NEGATIVE COMMENT: The negative hormone receptor study(ies) in this case have an internal positive control. All controls stained appropriately FINAL DIAGNOSIS Diagnosis 1. Lymph node, sentinel, biopsy, Right axillary #1 - ONE LYMPH NODE, NEGATIVE FOR METASTATIC CARCINOMA (0/1). 2. Lymph node, sentinel, biopsy, Right axillary #2 - ONE LYMPH NODE, NEGATIVE FOR METASTATIC CARCINOMA (0/1). 3. Lymph node, sentinel,  biopsy, Right axillary #3 - ONE LYMPH NODE, NEGATIVE FOR METASTATIC CARCINOMA. 4. Lymph node, sentinel, biopsy, Right axillary infra-mammary #4 - ONE LYMPH NODE, NEGATIVE FOR METASTATIC CARCINOMA (0/1). 5. Breast, simple mastectomy, Right - HIGH GRADE DUCTAL CARCINOMA IN SITU ARISING IN A BACKGROUND OF COMPLEX SCLEROSING LESION WITH INTRADUCTAL PAPILLOMA, PLEASE SEE ONCOLOGY TEMPLATE FOR DETAILS. - RESECTION MARGINS, NEGATIVE FOR ATYPIA OR MALIGNANCY. Microscopic Comment 5. BREAST, IN SITU CARCINOMA 1 oSf 4pecimen, including laterality: Right breast FINAL for Darlene Day, Darlene Day ([id number]) Microscopic Comment(continued) Procedure: Simple mastectomy Grade of carcinoma: High grade Necrosis: Present Estimated tumor size: (gross measurement): Two foci, 3.0 cm and 0.6 cm Treatment effect: No If present, treatment effect in breast tissue, lymph nodes or both: N/A Distance to closest margin: Deep margin: 1.0 cm If margin positive, focally or broadly: N/A Breast prognostic profile: Please correlate with previous case [id number]. Estrogen receptor: 0%, negative Progesterone receptor: 0%, negative; ER and PR will be repeated and an addendum report will follow. Lymph nodes: Examined: 4 Lymph nodes with metastasis: 0 Isolated tumor cells (< 0.2 mm): 0 Micrometastasis ( > 0.2 mm and < 2.0 mm): 0 Macrometastasis (> 2.0 mm): 0 Extranodal extension:0 TNM: pTis, pN0 (sn-) Comments: There are two foci of high grade ductal carcinoma in situ with similar morphologies, measuring 3.0 cm and 0.6 cm. Lobular cancerization is also present. Calponin, p63 and smooth muscle actin were performed and the stains confirm that there is no evidence of stromal invasion. The control stained appropriately. All of the final resection margins are negative for atypia or malignancy. 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 Patient is now status post mastectomy with sentinel node biopsy. 4 sentinel nodes were negative for metastatic disease. Patient had no evidence of invasive cancer. Her tumor was ER negative PR negative. Patient is seen for discussion of treatment options. At this time there is no plan and recommendation of any adjuvant therapy since her tumor was negative for both estrogen receptors and progesterone receptors.So has a significant family history of breast cancer as well as pancreatic cancers. She has had genetic counseling and testing performed for a comprehensive BRCA1 and 2 analysis. This was negative. Patient does have a daughter who is 3 years old and we extensively discussed her daughter her risk of developing breast cancer. We discussed her being seen in a high risk clinic for risk assessment and discussion of risk reducing strategies in terms  of preventing breast cancer risk.  PLAN:    #1 I will plan on seeing the patient back in 6 months time. If she is doing well then she can thereafter be seen on a yearly basis.  #2 I have recommended that patient's family be seen in the high-risk clinic for discussion of their risk of developing latencies and how to reduce this risk for cancer prevention.     Thank you so much for allowing me to participate in the care of Darlene Day. I will continue to follow up the patient with you and assist in her care.  All questions were answered. The patient knows to call the clinic with any problems, questions or concerns. We can certainly see the patient much sooner if necessary.  I spent 55 minutes counseling the patient face to face. The total time spent in the appointment was 60 minutes.  Drue Second, MD Medical/Oncology [facility] [phone] (beeper) [phone] (Office)  10/02/2011, 3:29 PM 10/02/2011, 3:29 PM

## 2011-10-06 ENCOUNTER — Encounter: Payer: Self-pay | Admitting: *Deleted

## 2011-10-06 ENCOUNTER — Ambulatory Visit (INDEPENDENT_AMBULATORY_CARE_PROVIDER_SITE_OTHER): Payer: Commercial Managed Care - PPO | Admitting: General Surgery

## 2011-10-06 DIAGNOSIS — Z4803 Encounter for change or removal of drains: Secondary | ICD-10-CM

## 2011-10-06 DIAGNOSIS — Z4889 Encounter for other specified surgical aftercare: Secondary | ICD-10-CM

## 2011-10-06 NOTE — Progress Notes (Signed)
Mailed after appt letter to pt. 

## 2011-10-06 NOTE — Progress Notes (Signed)
Patient comes in status post right mastectomy with drain still in place. Drain has been under 30 cc for 4 days. Drain was removed. Area covered with dry gauze/triple antibiotic ointment. Patient instructed to keep covered for 24 hours and then cover as needed. Appt made with Dr Magnus Ivan next week for a wound check. Patient instructed to call sooner if any problems.

## 2011-10-06 NOTE — Patient Instructions (Signed)
Call if any increase in drainage, redness or fevers. Keep follow up in one week.

## 2011-10-13 ENCOUNTER — Encounter (INDEPENDENT_AMBULATORY_CARE_PROVIDER_SITE_OTHER): Payer: Self-pay | Admitting: Surgery

## 2011-10-13 ENCOUNTER — Ambulatory Visit (INDEPENDENT_AMBULATORY_CARE_PROVIDER_SITE_OTHER): Payer: Commercial Managed Care - PPO | Admitting: Surgery

## 2011-10-13 VITALS — BP 164/80 | HR 89 | Temp 99.4°F | Ht 70.0 in | Wt 149.6 lb

## 2011-10-13 DIAGNOSIS — Z09 Encounter for follow-up examination after completed treatment for conditions other than malignant neoplasm: Secondary | ICD-10-CM

## 2011-10-13 NOTE — Progress Notes (Signed)
Subjective:     Patient ID: Darlene Day, female   DOB: 04/03/1941, 71 y.o.   MRN: [id number]  HPI  She is here for a recheck. She is a patient of Dr. Jamey Ripa.  She is status post right mastectomy. She had her final drain removed last week and is here for recheck. She has no complaints Review of Systems     Objective:   Physical Exam    Her incision is well healed and there is no evidence of seroma Assessment:     Postop status post right mastectomy for DCIS    Plan:     She will followup with Dr. Jamey Ripa with her already scheduled appointment

## 2011-10-21 ENCOUNTER — Encounter (INDEPENDENT_AMBULATORY_CARE_PROVIDER_SITE_OTHER): Payer: Self-pay | Admitting: Surgery

## 2011-10-21 ENCOUNTER — Encounter (INDEPENDENT_AMBULATORY_CARE_PROVIDER_SITE_OTHER): Payer: Self-pay | Admitting: General Surgery

## 2011-10-21 ENCOUNTER — Ambulatory Visit (INDEPENDENT_AMBULATORY_CARE_PROVIDER_SITE_OTHER): Payer: Commercial Managed Care - PPO | Admitting: Surgery

## 2011-10-21 VITALS — BP 124/62 | HR 68 | Temp 98.1°F | Resp 16 | Ht 70.0 in | Wt 148.8 lb

## 2011-10-21 DIAGNOSIS — C50119 Malignant neoplasm of central portion of unspecified female breast: Secondary | ICD-10-CM

## 2011-10-21 NOTE — Progress Notes (Signed)
Darlene Day    [id number] 10/21/2011    04/03/1941   CC: Post op right m,astectomy  HPI: The patient returns for post op follow-up. She underwent a Right on 422/13 Over all she feels that she is doing well. Still mild mpain  PE: The incision is healing nicely and there is no evidence of infection or hematoma.She has a small seroma   IMPRESSION: Patient doing well.  PLAN: Her next visit will be in two weeks - aspirated 30 cc.

## 2011-10-21 NOTE — Patient Instructions (Signed)
     Darlene Day  is OK to attend the ABC Class Currie Paris, MD, [facility], [place] [phone] 10/21/2011 2:20 PM

## 2011-11-04 ENCOUNTER — Ambulatory Visit (INDEPENDENT_AMBULATORY_CARE_PROVIDER_SITE_OTHER): Payer: Commercial Managed Care - PPO | Admitting: Surgery

## 2011-11-04 ENCOUNTER — Encounter (INDEPENDENT_AMBULATORY_CARE_PROVIDER_SITE_OTHER): Payer: Self-pay | Admitting: Surgery

## 2011-11-04 VITALS — BP 142/82 | HR 82 | Temp 98.8°F | Resp 14 | Ht 70.0 in | Wt 150.6 lb

## 2011-11-04 DIAGNOSIS — C50119 Malignant neoplasm of central portion of unspecified female breast: Secondary | ICD-10-CM

## 2011-11-04 NOTE — Progress Notes (Signed)
Darlene Day    [id number] 11/04/2011    Jan 25, 1941   CC: Post op right m,astectomy  HPI: The patient returns for post op follow-up. She underwent a Right on 422/13 Over all she feels that she is doing well. Still mild mpain  PE: The incision is healing nicely and there is no evidence of infection or hematoma.She has a small seroma   IMPRESSION: Patient doing well.  PLAN: Her next visit will be in two weeks - aspirated 35 cc.

## 2011-11-11 ENCOUNTER — Telehealth: Payer: Self-pay | Admitting: Genetic Counselor

## 2011-11-11 NOTE — Telephone Encounter (Signed)
LMOVM asking patient to call me back, as I had some questions about the outstanding genetic testing.

## 2011-11-17 ENCOUNTER — Ambulatory Visit (INDEPENDENT_AMBULATORY_CARE_PROVIDER_SITE_OTHER): Payer: Commercial Managed Care - PPO | Admitting: Surgery

## 2011-11-17 ENCOUNTER — Telehealth: Payer: Self-pay | Admitting: Genetic Counselor

## 2011-11-17 ENCOUNTER — Encounter (INDEPENDENT_AMBULATORY_CARE_PROVIDER_SITE_OTHER): Payer: Self-pay | Admitting: Surgery

## 2011-11-17 VITALS — BP 110/78 | HR 82 | Temp 98.8°F | Resp 14 | Ht 70.0 in | Wt 152.0 lb

## 2011-11-17 DIAGNOSIS — Z09 Encounter for follow-up examination after completed treatment for conditions other than malignant neoplasm: Secondary | ICD-10-CM

## 2011-11-17 NOTE — Progress Notes (Signed)
Subjective:     Patient ID: Darlene Day, female   DOB: 1940/12/26, 70 y.o.   MRN: [id number]  HPI This is a patient of Dr. Tenna Child who had a right mastectomy and sentinel node biopsy. Dr. Jamey Ripa has had to drain a seroma on 2 different occasions. She now presents for recheck. She is doing well but feels like some fluid has recurred which is asymptomatic  Review of Systems     Objective:   Physical Exam There is a small seroma on the right mastectomy site. I drained approximately 20 cc of  sanguinous fluid    Assessment:     Patient did well status post right mastectomy    Plan:     I will have her followup with Dr. Jamey Ripa in approximately 2 weeks

## 2011-11-17 NOTE — Telephone Encounter (Signed)
Left message on my voice mail that she did not want to go through the with second genetic test, as she received an EOB indicating that insurance would not cover it.  I contacted [facility] and cancelled the test.

## 2011-12-05 ENCOUNTER — Ambulatory Visit (INDEPENDENT_AMBULATORY_CARE_PROVIDER_SITE_OTHER): Payer: Commercial Managed Care - PPO | Admitting: Surgery

## 2011-12-05 ENCOUNTER — Encounter (INDEPENDENT_AMBULATORY_CARE_PROVIDER_SITE_OTHER): Payer: Self-pay | Admitting: Surgery

## 2011-12-05 VITALS — BP 142/86 | HR 88 | Temp 97.8°F | Resp 16 | Ht 70.0 in | Wt 156.0 lb

## 2011-12-05 DIAGNOSIS — Z09 Encounter for follow-up examination after completed treatment for conditions other than malignant neoplasm: Secondary | ICD-10-CM

## 2011-12-05 NOTE — Progress Notes (Signed)
Darlene Day    [id number] 12/05/2011    02/26/1941   CC: Post op Right mastectomy  HPI: The patient returns for post op follow-up. She underwent a A right mastectomy on 09/22/2011. Over all she feels that she is doing well. She had a persistent seroma and came back for check today but believes that the fluid is completely resolved.  PE: VITAL SIGNS: BP 142/86  Pulse 88  Temp 97.8 F (36.6 C) (Temporal)  Resp 16  Ht 5\' 10"  (1.778 m)  Wt 156 lb (70.761 kg)  BMI 22.38 kg/m2  The incision is healing nicely and there is no evidence of infection or hematoma. There is no residual seroma.  There is a question of some mild right lymphedema. She's been doing some exercises and did sustain a bruise so a slight swelling may be residual from the bruise and not lymphedema. If this does not improve will make another referral for physical therapy.    IMPRESSION: Patient doing well. Possibly developing mild lymphedema  PLAN: Her next visit will be in 6 months. I gave her a prescription for a breast prosthesis. She is going to call me if the arm hand swelling does not improve in another couple of weeks.Marland Kitchen

## 2011-12-05 NOTE — Patient Instructions (Signed)
See me again in about 6 months. It is okay go ahead and get a breast prosthesis fitted

## 2011-12-09 ENCOUNTER — Encounter (INDEPENDENT_AMBULATORY_CARE_PROVIDER_SITE_OTHER): Payer: Self-pay

## 2011-12-16 ENCOUNTER — Other Ambulatory Visit: Payer: Self-pay | Admitting: Internal Medicine

## 2011-12-16 DIAGNOSIS — R109 Unspecified abdominal pain: Secondary | ICD-10-CM

## 2011-12-17 ENCOUNTER — Ambulatory Visit
Admission: RE | Admit: 2011-12-17 | Discharge: 2011-12-17 | Disposition: A | Discharge status: Home / Self Care | Payer: Commercial Managed Care - PPO | Source: Ambulatory Visit | Attending: Internal Medicine | Admitting: Internal Medicine

## 2011-12-17 DIAGNOSIS — R109 Unspecified abdominal pain: Secondary | ICD-10-CM

## 2012-01-12 ENCOUNTER — Other Ambulatory Visit: Payer: Self-pay | Admitting: Gynecology

## 2012-02-01 HISTORY — PX: COLONOSCOPY: SHX174

## 2012-02-03 ENCOUNTER — Other Ambulatory Visit: Payer: Self-pay | Admitting: Gastroenterology

## 2012-04-08 ENCOUNTER — Telehealth: Payer: Self-pay | Admitting: *Deleted

## 2012-04-08 ENCOUNTER — Ambulatory Visit (HOSPITAL_BASED_OUTPATIENT_CLINIC_OR_DEPARTMENT_OTHER): Payer: Commercial Managed Care - PPO | Admitting: Oncology

## 2012-04-08 ENCOUNTER — Encounter: Payer: Self-pay | Admitting: Oncology

## 2012-04-08 VITALS — BP 152/74 | HR 85 | Temp 97.6°F | Resp 20 | Ht 70.0 in | Wt 155.6 lb

## 2012-04-08 DIAGNOSIS — C50119 Malignant neoplasm of central portion of unspecified female breast: Secondary | ICD-10-CM

## 2012-04-08 DIAGNOSIS — Z171 Estrogen receptor negative status [ER-]: Secondary | ICD-10-CM

## 2012-04-08 DIAGNOSIS — D0590 Unspecified type of carcinoma in situ of unspecified breast: Secondary | ICD-10-CM

## 2012-04-08 NOTE — Patient Instructions (Addendum)
Continue surveillance on a yearly

## 2012-04-08 NOTE — Progress Notes (Signed)
OFFICE PROGRESS NOTE  CC  Pearson Grippe, MD [address] [place] [address]  DIAGNOSIS: 71 year old female with DCIS of the right breast status post mastectomy with sentinel lymph node biopsy on 09/22/2011  PRIOR THERAPY:  #1 patient underwent a mastectomy on 09/22/2011 that revealed a ductal carcinoma in situ that was ER negative PR negative.  #2 patient has been on observation only.  CURRENT THERAPY: Observation  INTERVAL HISTORY: Darlene Day 71 y.o. female returns for followup visit today. Clinically she seems to be doing well she is without any significant complaints. She is accompanied by her daughter. She has seen Dr. Cyndia Bent and will be seeing him again in the next few months. She denies any nausea vomiting fevers chills night sweats no back pain no peripheral paresthesias no headaches or double vision no Donald pain. Remainder of the 10 point review of systems is negative.  MEDICAL HISTORY: Past Medical History  Diagnosis Date  . Papilloma of breast     right breast  . No pertinent past medical history   . Headache     hx  . Cancer     right breast    ALLERGIES:  is allergic to codeine.  MEDICATIONS:  Current Outpatient Prescriptions  Medication Sig Dispense Refill  . cetirizine (ZYRTEC) 10 MG tablet Take 10 mg by mouth daily.      . Multiple Vitamin (MULTIVITAMIN) tablet Take 1 tablet by mouth daily.      Marland Kitchen ALPRAZolam (XANAX) 0.5 MG tablet Take 0.5 mg by mouth at bedtime as needed. Patient takes 1/2 tablet prn per dosage.      Marland Kitchen aspirin 325 MG EC tablet Take 325 mg by mouth daily.      . Cholecalciferol (VITAMIN D-3 PO) Take 2,000 Units by mouth 2 (two) times daily.      . Cinnamon 500 MG capsule Take 500 mg by mouth daily.      Marland Kitchen ibuprofen (ADVIL,MOTRIN) 200 MG tablet Take 200 mg by mouth every 6 (six) hours as needed.      Marland Kitchen Lysine 500 MG CAPS Take by mouth.      Marland Kitchen omeprazole (PRILOSEC) 10 MG capsule Take 10 mg by mouth daily.       . Psyllium (METAMUCIL PO) Take by mouth daily.      . vitamin C (ASCORBIC ACID) 500 MG tablet Take 500 mg by mouth daily.        SURGICAL HISTORY:  Past Surgical History  Procedure Date  . Tubal ligation 1978  . Colonoscopy   . Cardiac catheterization 2005    normal  . Breast surgery 3/13    rt br bx  . Mastectomy w/ sentinel node biopsy 09/22/2011    Procedure: MASTECTOMY WITH SENTINEL LYMPH NODE BIOPSY;  Surgeon: Currie Paris, MD;  Location: [facility];  Service: General;  Laterality: Right;  Right mastectomy and removal of sentinel nodes    REVIEW OF SYSTEMS:  Pertinent items are noted in HPI.   HEALTH MAINTENANCE:  Mammogram 2013 Colonoscopy 01/05/12 Bone  Scan 2013 Pap Smear 2013 Eye Exam 04/20/2012 scheduled Vitamin D 2013 Lipid Panel 12/2011  PHYSICAL EXAMINATION: Blood pressure 152/74, pulse 85, temperature 97.6 F (36.4 C), temperature source Oral, resp. rate 20, height 5\' 10"  (1.778 m), weight 155 lb 9.6 oz (70.58 kg). Body mass index is 22.33 kg/(m^2). ECOG PERFORMANCE STATUS: 0 - Asymptomatic   General appearance: alert, cooperative and appears stated age Lymph nodes: Cervical, supraclavicular, and  axillary nodes normal. Resp: clear to auscultation bilaterally Back: symmetric, no curvature. ROM normal. No CVA tenderness. Cardio: regular rate and rhythm GI: soft, non-tender; bowel sounds normal; no masses,  no organomegaly Extremities: No clubbing edema or cyanosis Neurologic: Grossly normal Left breast no masses nipple discharge or skin changes. Right mastectomy scar looks well healed there is no evidence of local recurrence.  LABORATORY DATA: Lab Results  Component Value Date   WBC 5.4 10/02/2011   HGB 13.4 10/02/2011   HCT 41.0 10/02/2011   MCV 81.8 10/02/2011   PLT 182 10/02/2011      Chemistry      Component Value Date/Time   NA 143 10/02/2011 1306   K 4.1 10/02/2011 1306   CL 104 10/02/2011 1306   CO2 33* 10/02/2011 1306   BUN 16  10/02/2011 1306   CREATININE 0.91 10/02/2011 1306      Component Value Date/Time   CALCIUM 9.6 10/02/2011 1306   ALKPHOS 54 10/02/2011 1306   AST 17 10/02/2011 1306   ALT 15 10/02/2011 1306   BILITOT 0.6 10/02/2011 1306       RADIOGRAPHIC STUDIES:  No results found.  ASSESSMENT: 71 year old female with DCIS of the right breast status post mastectomy. The tumor was estrogen receptor negative progesterone receptor negative. She did not receive and tie estrogen therapy since her tumor was ER negative. And would not have any benefit for treatment. Overall she is doing well and has no evidence of local recurrence in the ipsilateral scar area. No evidence of new disease in the contralateral breast.   PLAN:   #1 patient will continue to be seen on a yearly basis from now on   All questions were answered. The patient knows to call the clinic with any problems, questions or concerns. We can certainly see the patient much sooner if necessary.  I spent 15 minutes counseling the patient face to face. The total time spent in the appointment was 30 minutes.    Drue Second, MD Medical/Oncology [facility] [phone] (beeper) [phone] (Office)  04/08/2012, 11:00 AM

## 2012-04-08 NOTE — Telephone Encounter (Signed)
Gave patient appointment for 04-08-2013 starting at 2:00pm

## 2012-04-08 NOTE — Progress Notes (Deleted)
OFFICE PROGRESS NOTE  CC  Pearson Grippe, MD [address] [place] [address]  DIAGNOSIS: ***  PRIOR THERAPY:***  CURRENT THERAPY:***  INTERVAL HISTORY: CARIZMA MCMURRY 71 y.o. female returns for***  MEDICAL HISTORY: Past Medical History  Diagnosis Date  . Papilloma of breast     right breast  . No pertinent past medical history   . Headache     hx  . Cancer     right breast    ALLERGIES:  is allergic to codeine.  MEDICATIONS:  Current Outpatient Prescriptions  Medication Sig Dispense Refill  . cetirizine (ZYRTEC) 10 MG tablet Take 10 mg by mouth daily.      . Multiple Vitamin (MULTIVITAMIN) tablet Take 1 tablet by mouth daily.      Marland Kitchen ALPRAZolam (XANAX) 0.5 MG tablet Take 0.5 mg by mouth at bedtime as needed. Patient takes 1/2 tablet prn per dosage.      Marland Kitchen aspirin 325 MG EC tablet Take 325 mg by mouth daily.      . Cholecalciferol (VITAMIN D-3 PO) Take 2,000 Units by mouth 2 (two) times daily.      . Cinnamon 500 MG capsule Take 500 mg by mouth daily.      Marland Kitchen ibuprofen (ADVIL,MOTRIN) 200 MG tablet Take 200 mg by mouth every 6 (six) hours as needed.      Marland Kitchen Lysine 500 MG CAPS Take by mouth.      Marland Kitchen omeprazole (PRILOSEC) 10 MG capsule Take 10 mg by mouth daily.      . Psyllium (METAMUCIL PO) Take by mouth daily.      . vitamin C (ASCORBIC ACID) 500 MG tablet Take 500 mg by mouth daily.        SURGICAL HISTORY:  Past Surgical History  Procedure Date  . Tubal ligation 1978  . Colonoscopy   . Cardiac catheterization 2005    normal  . Breast surgery 3/13    rt br bx  . Mastectomy w/ sentinel node biopsy 09/22/2011    Procedure: MASTECTOMY WITH SENTINEL LYMPH NODE BIOPSY;  Surgeon: Currie Paris, MD;  Location: [facility];  Service: General;  Laterality: Right;  Right mastectomy and removal of sentinel nodes    REVIEW OF SYSTEMS:  {Ros - complete:30496}   HEALTH MAINTENANCE:  Mammogram*** Colonoscopy*** Bone  Scan*** Pap  Smear*** Eye Exam*** Vitamin D*** Lipid Panel***  PHYSICAL EXAMINATION: Blood pressure 152/74, pulse 85, temperature 97.6 F (36.4 C), temperature source Oral, resp. rate 20, height 5\' 10"  (1.778 m), weight 155 lb 9.6 oz (70.58 kg). Body mass index is 22.33 kg/(m^2). ECOG PERFORMANCE STATUS: {CHL ONC ECOG [id number]   {physical exam:21449}   LABORATORY DATA: Lab Results  Component Value Date   WBC 5.4 10/02/2011   HGB 13.4 10/02/2011   HCT 41.0 10/02/2011   MCV 81.8 10/02/2011   PLT 182 10/02/2011      Chemistry      Component Value Date/Time   NA 143 10/02/2011 1306   K 4.1 10/02/2011 1306   CL 104 10/02/2011 1306   CO2 33* 10/02/2011 1306   BUN 16 10/02/2011 1306   CREATININE 0.91 10/02/2011 1306      Component Value Date/Time   CALCIUM 9.6 10/02/2011 1306   ALKPHOS 54 10/02/2011 1306   AST 17 10/02/2011 1306   ALT 15 10/02/2011 1306   BILITOT 0.6 10/02/2011 1306       RADIOGRAPHIC STUDIES:  No results found.  ASSESSMENT: ***  PLAN: ***   All questions were answered. The patient knows to call the clinic with any problems, questions or concerns. We can certainly see the patient much sooner if necessary.  I spent {CHL ONC TIME VISIT - JXBJY:[id number]} counseling the patient face to face. The total time spent in the appointment was {CHL ONC TIME VISIT - QMVHQ:[id number]}.    Drue Second, MD Medical/Oncology [facility] [phone] (beeper) [phone] (Office)  04/08/2012, 10:52 AM

## 2012-05-03 ENCOUNTER — Telehealth (INDEPENDENT_AMBULATORY_CARE_PROVIDER_SITE_OTHER): Payer: Self-pay | Admitting: General Surgery

## 2012-05-03 NOTE — Telephone Encounter (Signed)
Appt made 06/01/2012.

## 2012-05-03 NOTE — Telephone Encounter (Signed)
Message copied by Liliana Cline on Mon May 03, 2012 11:54 AM ------      Message from: Zacarias Pontes      Created: Mon May 03, 2012 10:03 AM       Pt needs 6 mo reck apt. I tried to make one for Jan 3 but she said Dr Jamey Ripa told her to come in in Dec??? Her # is [phone]

## 2012-06-01 ENCOUNTER — Encounter (INDEPENDENT_AMBULATORY_CARE_PROVIDER_SITE_OTHER): Payer: Self-pay | Admitting: Surgery

## 2012-06-01 ENCOUNTER — Ambulatory Visit (INDEPENDENT_AMBULATORY_CARE_PROVIDER_SITE_OTHER): Payer: Commercial Managed Care - PPO | Admitting: Surgery

## 2012-06-01 VITALS — BP 142/80 | HR 76 | Temp 98.8°F | Resp 16 | Ht 69.0 in | Wt 156.2 lb

## 2012-06-01 DIAGNOSIS — Z853 Personal history of malignant neoplasm of breast: Secondary | ICD-10-CM

## 2012-06-01 NOTE — Progress Notes (Signed)
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 She has no problems or concerns on either side.  PFSH: She has had no significant changes since the last visit here.  ROS: There have been no significant changes since the last visit here  EXAM:  VS: BP 142/80  Pulse 76  Temp 98.8 F (37.1 C) (Temporal)  Resp 16  Ht 5\' 9"  (1.753 m)  Wt 156 lb 3.2 oz (70.852 kg)  BMI 23.07 kg/m2  General: The patient is alert, oriented, generally healthy appearing, NAD. Mood and affect are normal.  Breasts:  Right is surgically absent, no evidence of recurrence, left totally normal  Lymphatics: She has no axillary or supraclavicular adenopathy on either side.  Extremities: Full ROM of the surgical side with no lymphedema noted.  Data Reviewed: No new data; mammogram due in March  Impression: Doing well, with no evidence of recurrent cancer or new cancer  Plan: Will see again in six months

## 2012-06-01 NOTE — Patient Instructions (Signed)
Continue annual mammograms, see me again in six months

## 2012-06-17 ENCOUNTER — Telehealth (INDEPENDENT_AMBULATORY_CARE_PROVIDER_SITE_OTHER): Payer: Self-pay | Admitting: General Surgery

## 2012-06-17 NOTE — Telephone Encounter (Signed)
Patient calling to get order faxed to [employer] for post mastectomy bras and prosthesis. I made her aware I would get Dr Jamey Ripa to sign an RX and fax it to them in the morning at [phone].

## 2012-06-18 NOTE — Telephone Encounter (Signed)
[employer] faxed to [phone].

## 2012-08-04 DIAGNOSIS — R112 Nausea with vomiting, unspecified: Secondary | ICD-10-CM | POA: Diagnosis not present

## 2012-08-04 DIAGNOSIS — F411 Generalized anxiety disorder: Secondary | ICD-10-CM | POA: Diagnosis not present

## 2012-09-01 DIAGNOSIS — R7989 Other specified abnormal findings of blood chemistry: Secondary | ICD-10-CM | POA: Diagnosis not present

## 2012-09-01 DIAGNOSIS — Z0000 Encounter for general adult medical examination without abnormal findings: Secondary | ICD-10-CM | POA: Diagnosis not present

## 2012-11-30 ENCOUNTER — Ambulatory Visit (INDEPENDENT_AMBULATORY_CARE_PROVIDER_SITE_OTHER): Payer: Medicare Other | Admitting: Surgery

## 2012-11-30 ENCOUNTER — Encounter (INDEPENDENT_AMBULATORY_CARE_PROVIDER_SITE_OTHER): Payer: Self-pay | Admitting: Surgery

## 2012-11-30 VITALS — BP 130/80 | HR 72 | Temp 98.4°F | Resp 16 | Ht 69.0 in | Wt 155.2 lb

## 2012-11-30 DIAGNOSIS — Z853 Personal history of malignant neoplasm of breast: Secondary | ICD-10-CM

## 2012-11-30 NOTE — Progress Notes (Signed)
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 She has no problems or concerns on either side.  PFSH: She has had no significant changes since the last visit here.  ROS: There have been no significant changes since the last visit here  EXAM:  VS: BP 130/80  Pulse 72  Temp(Src) 98.4 F (36.9 C) (Temporal)  Resp 16  Ht 5\' 9"  (1.753 m)  Wt 155 lb 3.2 oz (70.398 kg)  BMI 22.91 kg/m2  General: The patient is alert, oriented, generally healthy appearing, NAD. Mood and affect are normal.  Breasts:  Right is surgically absent, no evidence of recurrence, left totally normal  Lymphatics: She has no axillary or supraclavicular adenopathy on either side.  Extremities: Full ROM of the surgical side with no lymphedema noted.  Data Reviewed: No new data; mammogram due now.  Impression: Doing well, with no evidence of recurrent cancer or new cancer  Plan: Will have her seen annually

## 2012-11-30 NOTE — Patient Instructions (Signed)
Have your mammogram and ask that we get a copy. See [place] again in one year

## 2012-12-01 ENCOUNTER — Other Ambulatory Visit: Payer: Self-pay

## 2012-12-01 DIAGNOSIS — Z9011 Acquired absence of right breast and nipple: Secondary | ICD-10-CM

## 2012-12-01 DIAGNOSIS — Z79899 Other long term (current) drug therapy: Secondary | ICD-10-CM | POA: Diagnosis not present

## 2012-12-01 DIAGNOSIS — Z1231 Encounter for screening mammogram for malignant neoplasm of breast: Secondary | ICD-10-CM

## 2012-12-08 ENCOUNTER — Other Ambulatory Visit: Payer: Self-pay | Admitting: Internal Medicine

## 2012-12-08 DIAGNOSIS — M810 Age-related osteoporosis without current pathological fracture: Secondary | ICD-10-CM | POA: Diagnosis not present

## 2012-12-08 DIAGNOSIS — R748 Abnormal levels of other serum enzymes: Secondary | ICD-10-CM

## 2012-12-08 DIAGNOSIS — C50919 Malignant neoplasm of unspecified site of unspecified female breast: Secondary | ICD-10-CM | POA: Diagnosis not present

## 2012-12-08 DIAGNOSIS — F411 Generalized anxiety disorder: Secondary | ICD-10-CM | POA: Diagnosis not present

## 2012-12-13 ENCOUNTER — Ambulatory Visit
Admission: RE | Admit: 2012-12-13 | Discharge: 2012-12-13 | Disposition: A | Discharge status: Home / Self Care | Payer: Medicare Other | Source: Ambulatory Visit | Attending: Internal Medicine | Admitting: Internal Medicine

## 2012-12-13 DIAGNOSIS — D1803 Hemangioma of intra-abdominal structures: Secondary | ICD-10-CM | POA: Diagnosis not present

## 2012-12-13 DIAGNOSIS — R748 Abnormal levels of other serum enzymes: Secondary | ICD-10-CM

## 2012-12-21 ENCOUNTER — Ambulatory Visit: Payer: Commercial Managed Care - PPO

## 2012-12-26 DIAGNOSIS — S72001A Fracture of unspecified part of neck of right femur, initial encounter for closed fracture: Secondary | ICD-10-CM

## 2012-12-26 HISTORY — DX: Fracture of unspecified part of neck of right femur, initial encounter for closed fracture: S72.001A

## 2012-12-27 DIAGNOSIS — Z23 Encounter for immunization: Secondary | ICD-10-CM | POA: Diagnosis not present

## 2012-12-27 DIAGNOSIS — S79919A Unspecified injury of unspecified hip, initial encounter: Secondary | ICD-10-CM | POA: Diagnosis not present

## 2012-12-27 DIAGNOSIS — H1045 Other chronic allergic conjunctivitis: Secondary | ICD-10-CM | POA: Diagnosis not present

## 2012-12-27 DIAGNOSIS — D62 Acute posthemorrhagic anemia: Secondary | ICD-10-CM | POA: Diagnosis not present

## 2012-12-27 DIAGNOSIS — M25559 Pain in unspecified hip: Secondary | ICD-10-CM | POA: Diagnosis not present

## 2012-12-27 DIAGNOSIS — S72009A Fracture of unspecified part of neck of unspecified femur, initial encounter for closed fracture: Secondary | ICD-10-CM | POA: Diagnosis not present

## 2012-12-27 DIAGNOSIS — S298XXA Other specified injuries of thorax, initial encounter: Secondary | ICD-10-CM | POA: Diagnosis not present

## 2012-12-27 DIAGNOSIS — S72143A Displaced intertrochanteric fracture of unspecified femur, initial encounter for closed fracture: Secondary | ICD-10-CM | POA: Diagnosis not present

## 2012-12-27 DIAGNOSIS — Z885 Allergy status to narcotic agent status: Secondary | ICD-10-CM | POA: Diagnosis not present

## 2012-12-27 DIAGNOSIS — R52 Pain, unspecified: Secondary | ICD-10-CM | POA: Diagnosis not present

## 2012-12-27 DIAGNOSIS — Z09 Encounter for follow-up examination after completed treatment for conditions other than malignant neoplasm: Secondary | ICD-10-CM | POA: Diagnosis not present

## 2012-12-27 DIAGNOSIS — Z7982 Long term (current) use of aspirin: Secondary | ICD-10-CM | POA: Diagnosis not present

## 2012-12-27 DIAGNOSIS — Z853 Personal history of malignant neoplasm of breast: Secondary | ICD-10-CM | POA: Diagnosis not present

## 2012-12-31 DIAGNOSIS — R269 Unspecified abnormalities of gait and mobility: Secondary | ICD-10-CM | POA: Diagnosis not present

## 2012-12-31 DIAGNOSIS — W108XXA Fall (on) (from) other stairs and steps, initial encounter: Secondary | ICD-10-CM | POA: Diagnosis not present

## 2012-12-31 DIAGNOSIS — Z9181 History of falling: Secondary | ICD-10-CM | POA: Diagnosis not present

## 2012-12-31 DIAGNOSIS — S72143A Displaced intertrochanteric fracture of unspecified femur, initial encounter for closed fracture: Secondary | ICD-10-CM | POA: Diagnosis not present

## 2012-12-31 DIAGNOSIS — IMO0001 Reserved for inherently not codable concepts without codable children: Secondary | ICD-10-CM | POA: Diagnosis not present

## 2012-12-31 DIAGNOSIS — M6281 Muscle weakness (generalized): Secondary | ICD-10-CM | POA: Diagnosis not present

## 2012-12-31 DIAGNOSIS — S72009D Fracture of unspecified part of neck of unspecified femur, subsequent encounter for closed fracture with routine healing: Secondary | ICD-10-CM | POA: Diagnosis not present

## 2012-12-31 DIAGNOSIS — R279 Unspecified lack of coordination: Secondary | ICD-10-CM | POA: Diagnosis not present

## 2013-01-03 DIAGNOSIS — R269 Unspecified abnormalities of gait and mobility: Secondary | ICD-10-CM | POA: Diagnosis not present

## 2013-01-03 DIAGNOSIS — S72009D Fracture of unspecified part of neck of unspecified femur, subsequent encounter for closed fracture with routine healing: Secondary | ICD-10-CM | POA: Diagnosis not present

## 2013-01-03 DIAGNOSIS — Z9181 History of falling: Secondary | ICD-10-CM | POA: Diagnosis not present

## 2013-01-03 DIAGNOSIS — M6281 Muscle weakness (generalized): Secondary | ICD-10-CM | POA: Diagnosis not present

## 2013-01-03 DIAGNOSIS — IMO0001 Reserved for inherently not codable concepts without codable children: Secondary | ICD-10-CM | POA: Diagnosis not present

## 2013-01-04 ENCOUNTER — Ambulatory Visit: Payer: Commercial Managed Care - PPO

## 2013-01-05 DIAGNOSIS — S72009D Fracture of unspecified part of neck of unspecified femur, subsequent encounter for closed fracture with routine healing: Secondary | ICD-10-CM | POA: Diagnosis not present

## 2013-01-05 DIAGNOSIS — IMO0001 Reserved for inherently not codable concepts without codable children: Secondary | ICD-10-CM | POA: Diagnosis not present

## 2013-01-05 DIAGNOSIS — M6281 Muscle weakness (generalized): Secondary | ICD-10-CM | POA: Diagnosis not present

## 2013-01-05 DIAGNOSIS — R269 Unspecified abnormalities of gait and mobility: Secondary | ICD-10-CM | POA: Diagnosis not present

## 2013-01-05 DIAGNOSIS — Z9181 History of falling: Secondary | ICD-10-CM | POA: Diagnosis not present

## 2013-01-06 DIAGNOSIS — S72009D Fracture of unspecified part of neck of unspecified femur, subsequent encounter for closed fracture with routine healing: Secondary | ICD-10-CM | POA: Diagnosis not present

## 2013-01-06 DIAGNOSIS — Z9889 Other specified postprocedural states: Secondary | ICD-10-CM | POA: Diagnosis not present

## 2013-01-07 DIAGNOSIS — S72009D Fracture of unspecified part of neck of unspecified femur, subsequent encounter for closed fracture with routine healing: Secondary | ICD-10-CM | POA: Diagnosis not present

## 2013-01-07 DIAGNOSIS — R269 Unspecified abnormalities of gait and mobility: Secondary | ICD-10-CM | POA: Diagnosis not present

## 2013-01-07 DIAGNOSIS — Z9181 History of falling: Secondary | ICD-10-CM | POA: Diagnosis not present

## 2013-01-07 DIAGNOSIS — IMO0001 Reserved for inherently not codable concepts without codable children: Secondary | ICD-10-CM | POA: Diagnosis not present

## 2013-01-07 DIAGNOSIS — M6281 Muscle weakness (generalized): Secondary | ICD-10-CM | POA: Diagnosis not present

## 2013-01-10 DIAGNOSIS — M6281 Muscle weakness (generalized): Secondary | ICD-10-CM | POA: Diagnosis not present

## 2013-01-10 DIAGNOSIS — R269 Unspecified abnormalities of gait and mobility: Secondary | ICD-10-CM | POA: Diagnosis not present

## 2013-01-10 DIAGNOSIS — Z9181 History of falling: Secondary | ICD-10-CM | POA: Diagnosis not present

## 2013-01-10 DIAGNOSIS — S72009D Fracture of unspecified part of neck of unspecified femur, subsequent encounter for closed fracture with routine healing: Secondary | ICD-10-CM | POA: Diagnosis not present

## 2013-01-10 DIAGNOSIS — IMO0001 Reserved for inherently not codable concepts without codable children: Secondary | ICD-10-CM | POA: Diagnosis not present

## 2013-01-12 DIAGNOSIS — Z9181 History of falling: Secondary | ICD-10-CM | POA: Diagnosis not present

## 2013-01-12 DIAGNOSIS — IMO0001 Reserved for inherently not codable concepts without codable children: Secondary | ICD-10-CM | POA: Diagnosis not present

## 2013-01-12 DIAGNOSIS — M6281 Muscle weakness (generalized): Secondary | ICD-10-CM | POA: Diagnosis not present

## 2013-01-12 DIAGNOSIS — R269 Unspecified abnormalities of gait and mobility: Secondary | ICD-10-CM | POA: Diagnosis not present

## 2013-01-12 DIAGNOSIS — S72009D Fracture of unspecified part of neck of unspecified femur, subsequent encounter for closed fracture with routine healing: Secondary | ICD-10-CM | POA: Diagnosis not present

## 2013-01-14 DIAGNOSIS — R269 Unspecified abnormalities of gait and mobility: Secondary | ICD-10-CM | POA: Diagnosis not present

## 2013-01-14 DIAGNOSIS — M6281 Muscle weakness (generalized): Secondary | ICD-10-CM | POA: Diagnosis not present

## 2013-01-14 DIAGNOSIS — Z9181 History of falling: Secondary | ICD-10-CM | POA: Diagnosis not present

## 2013-01-14 DIAGNOSIS — S72009D Fracture of unspecified part of neck of unspecified femur, subsequent encounter for closed fracture with routine healing: Secondary | ICD-10-CM | POA: Diagnosis not present

## 2013-01-14 DIAGNOSIS — IMO0001 Reserved for inherently not codable concepts without codable children: Secondary | ICD-10-CM | POA: Diagnosis not present

## 2013-01-17 DIAGNOSIS — M6281 Muscle weakness (generalized): Secondary | ICD-10-CM | POA: Diagnosis not present

## 2013-01-17 DIAGNOSIS — S72009D Fracture of unspecified part of neck of unspecified femur, subsequent encounter for closed fracture with routine healing: Secondary | ICD-10-CM | POA: Diagnosis not present

## 2013-01-17 DIAGNOSIS — Z9181 History of falling: Secondary | ICD-10-CM | POA: Diagnosis not present

## 2013-01-17 DIAGNOSIS — R269 Unspecified abnormalities of gait and mobility: Secondary | ICD-10-CM | POA: Diagnosis not present

## 2013-01-17 DIAGNOSIS — IMO0001 Reserved for inherently not codable concepts without codable children: Secondary | ICD-10-CM | POA: Diagnosis not present

## 2013-01-19 DIAGNOSIS — R269 Unspecified abnormalities of gait and mobility: Secondary | ICD-10-CM | POA: Diagnosis not present

## 2013-01-19 DIAGNOSIS — S72009D Fracture of unspecified part of neck of unspecified femur, subsequent encounter for closed fracture with routine healing: Secondary | ICD-10-CM | POA: Diagnosis not present

## 2013-01-19 DIAGNOSIS — IMO0001 Reserved for inherently not codable concepts without codable children: Secondary | ICD-10-CM | POA: Diagnosis not present

## 2013-01-19 DIAGNOSIS — Z9181 History of falling: Secondary | ICD-10-CM | POA: Diagnosis not present

## 2013-01-19 DIAGNOSIS — M6281 Muscle weakness (generalized): Secondary | ICD-10-CM | POA: Diagnosis not present

## 2013-01-20 DIAGNOSIS — R269 Unspecified abnormalities of gait and mobility: Secondary | ICD-10-CM | POA: Diagnosis not present

## 2013-01-20 DIAGNOSIS — Z9181 History of falling: Secondary | ICD-10-CM | POA: Diagnosis not present

## 2013-01-20 DIAGNOSIS — S72009D Fracture of unspecified part of neck of unspecified femur, subsequent encounter for closed fracture with routine healing: Secondary | ICD-10-CM | POA: Diagnosis not present

## 2013-01-20 DIAGNOSIS — M6281 Muscle weakness (generalized): Secondary | ICD-10-CM | POA: Diagnosis not present

## 2013-01-20 DIAGNOSIS — IMO0001 Reserved for inherently not codable concepts without codable children: Secondary | ICD-10-CM | POA: Diagnosis not present

## 2013-02-08 DIAGNOSIS — S72009D Fracture of unspecified part of neck of unspecified femur, subsequent encounter for closed fracture with routine healing: Secondary | ICD-10-CM | POA: Diagnosis not present

## 2013-03-03 DIAGNOSIS — Z23 Encounter for immunization: Secondary | ICD-10-CM | POA: Diagnosis not present

## 2013-03-24 DIAGNOSIS — Z9889 Other specified postprocedural states: Secondary | ICD-10-CM | POA: Diagnosis not present

## 2013-03-24 DIAGNOSIS — M25559 Pain in unspecified hip: Secondary | ICD-10-CM | POA: Diagnosis not present

## 2013-03-25 ENCOUNTER — Ambulatory Visit: Payer: Medicare Other

## 2013-03-29 ENCOUNTER — Ambulatory Visit
Admission: RE | Admit: 2013-03-29 | Discharge: 2013-03-29 | Disposition: A | Discharge status: Home / Self Care | Payer: Medicare Other | Source: Ambulatory Visit

## 2013-03-29 DIAGNOSIS — Z9011 Acquired absence of right breast and nipple: Secondary | ICD-10-CM

## 2013-03-29 DIAGNOSIS — Z1231 Encounter for screening mammogram for malignant neoplasm of breast: Secondary | ICD-10-CM | POA: Diagnosis not present

## 2013-04-07 ENCOUNTER — Other Ambulatory Visit: Payer: Self-pay | Admitting: Family

## 2013-04-07 DIAGNOSIS — Z853 Personal history of malignant neoplasm of breast: Secondary | ICD-10-CM

## 2013-04-08 ENCOUNTER — Ambulatory Visit (HOSPITAL_BASED_OUTPATIENT_CLINIC_OR_DEPARTMENT_OTHER): Payer: Medicare Other | Admitting: Family

## 2013-04-08 ENCOUNTER — Encounter (INDEPENDENT_AMBULATORY_CARE_PROVIDER_SITE_OTHER): Payer: Self-pay

## 2013-04-08 ENCOUNTER — Other Ambulatory Visit (HOSPITAL_BASED_OUTPATIENT_CLINIC_OR_DEPARTMENT_OTHER): Payer: Medicare Other

## 2013-04-08 ENCOUNTER — Ambulatory Visit: Payer: Medicare Other | Admitting: Oncology

## 2013-04-08 ENCOUNTER — Encounter: Payer: Self-pay | Admitting: Family

## 2013-04-08 ENCOUNTER — Telehealth: Payer: Self-pay | Admitting: Oncology

## 2013-04-08 VITALS — BP 152/75 | HR 92 | Temp 97.9°F | Resp 20 | Ht 69.0 in | Wt 161.0 lb

## 2013-04-08 DIAGNOSIS — D0590 Unspecified type of carcinoma in situ of unspecified breast: Secondary | ICD-10-CM | POA: Diagnosis not present

## 2013-04-08 DIAGNOSIS — Z853 Personal history of malignant neoplasm of breast: Secondary | ICD-10-CM | POA: Diagnosis not present

## 2013-04-08 DIAGNOSIS — Z171 Estrogen receptor negative status [ER-]: Secondary | ICD-10-CM

## 2013-04-08 DIAGNOSIS — C50119 Malignant neoplasm of central portion of unspecified female breast: Secondary | ICD-10-CM | POA: Diagnosis not present

## 2013-04-08 LAB — CBC WITH DIFFERENTIAL/PLATELET
Basophils Absolute: 0 10*3/uL (ref 0.0–0.1)
EOS%: 0.8 % (ref 0.0–7.0)
Eosinophils Absolute: 0.1 10*3/uL (ref 0.0–0.5)
HGB: 12.2 g/dL (ref 11.6–15.9)
LYMPH%: 22 % (ref 14.0–49.7)
MCH: 25.1 pg (ref 25.1–34.0)
MCV: 78 fL — ABNORMAL LOW (ref 79.5–101.0)
MONO%: 6.2 % (ref 0.0–14.0)
NEUT#: 4.5 10*3/uL (ref 1.5–6.5)
Platelets: 188 10*3/uL (ref 145–400)

## 2013-04-08 NOTE — Progress Notes (Addendum)
[facility]  [phone] Fax:([phone]  OFFICE PROGRESS NOTE  ID: Darlene Day   DOB: 06/26/1940  MR#: [id number]  MVH#:[id number]   PCP: Pearson Grippe, MD     DIAGNOSIS: Darlene Day is a 72 y.o.  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   PRIOR THERAPY: #1 Underwent a right breast simple mastectomy with right axillary sentinel lymph node biopsy on 09/22/2011 for a stage 0, pTis, pN0 (sn-), 3.0 and 0.6  high grade ductal carcinoma in situ, estrogen receptor negative, progesterone receptor negative, with 0/4 metastatic right axillary lymph nodes.  #2 Patient has been on observation since definitive breast surgery.   CURRENT THERAPY: Observation   INTERVAL HISTORY: Darlene Day is a 72 y.o. female who returns for followup of right breast DCIS today.  She is accompanied for today's office visit by her daughter Melvenia Needles.  Since her last office visit on 04/08/2012, her interval history is significant for suffering a right hip fracture after a fall in which the hip fracture was repaired with rods/pins.  She continues to exercise twice daily.  She also has complaints of night sweats that she states are tolerable.  Her interval history is otherwise stable.   MEDICAL HISTORY: Past Medical History  Diagnosis Date  . Papilloma of breast     right breast  . No pertinent past medical history   . Headache(784.0)     hx  . Hip fracture, right 12/26/2012  . Cancer 08/2011    Right breast    ALLERGIES:   Allergies  Allergen Reactions  . Codeine Nausea And Vomiting    MEDICATIONS:  Current Outpatient Prescriptions  Medication Sig Dispense Refill  . acetaminophen (TYLENOL) 500 MG tablet Take 1,000 mg by mouth every 6 (six) hours as needed.      Marland Kitchen aspirin 325 MG EC tablet Take 325 mg by mouth daily.      . Cholecalciferol  (VITAMIN D-3 PO) Take 1,000 Units by mouth 2 (two) times daily.       Marland Kitchen CINNAMON PO Take 1,000 mg by mouth 2 (two) times daily.      . citalopram (CELEXA) 10 MG tablet Take 10 mg by mouth daily.      Marland Kitchen Lysine 500 MG CAPS Take by mouth.      . Multiple Vitamin (MULTIVITAMIN) tablet Take 1 tablet by mouth daily.      . Psyllium (METAMUCIL PO) Take 8 oz by mouth daily.       . vitamin C (ASCORBIC ACID) 500 MG tablet Take 500 mg by mouth daily.       No current facility-administered medications for this visit.    SURGICAL HISTORY:  Past Surgical History  Procedure Laterality Date  . Tubal ligation  1978  . Colonoscopy    . Cardiac catheterization  2005    normal  . Breast surgery  3/13    rt br bx  . Mastectomy w/ sentinel node biopsy  09/22/2011    Procedure: MASTECTOMY WITH SENTINEL LYMPH NODE BIOPSY;  Surgeon: Currie Paris, MD;  Location: [facility];  Service: General;  Laterality: Right;  Right mastectomy and removal of sentinel nodes  . Im nailing femoral shaft fracture Right     REVIEW OF SYSTEMS:  Pertinent items are noted in HPI. Ms. Penagos denies any other symptomatology including fatigue, fever or chills, headache, vision  changes, swollen glands, cough or shortness of breath, chest pain or discomfort, nausea, vomiting, diarrhea, constipation, change in urinary or bowel habits, arthralgias/myalgias, unusual bleeding/bruising or any other symptomatology.   HEALTH MAINTENANCE: Mammogram 03/29/2013 Colonoscopy 01/05/12 Bone  Scan 2013 Pap Smear 2013 Eye Exam 04/20/2012  Vitamin D 2013 Lipid Panel 12/2011  PHYSICAL EXAMINATION: Blood pressure 152/75, pulse 92, temperature 97.9 F (36.6 C), temperature source Oral, resp. rate 20, height 5\' 9"  (1.753 m), weight 161 lb (73.029 kg). Body mass index is 23.76 kg/(m^2).  ECOG PERFORMANCE STATUS: 1 - Symptomatic but completely ambulatory  General appearance: Alert, cooperative, well nourished, no apparent  distress Head: Normocephalic, without obvious abnormality, atraumatic Eyes: Arcus senilis, PERRLA, EOMI Nose: Nares, septum and mucosa are normal, no drainage or sinus tenderness Neck: No adenopathy, supple, symmetrical, trachea midline, no tenderness Resp: Clear to auscultation bilaterally, no wheezes/rales/rhonchi Cardio: Regular rate and rhythm, S1, S2 normal, no murmur, click, rub or gallop, no edema Breasts:  Right breast is surgically absent, right chest area has well-healed surgical scars, left breast does not have any nipple inversion, bilateral axillary fullness GI: Soft, not distended, non-tender, normoactive bowel sounds, no organomegaly Skin: No rashes/lesions, skin warm and dry, no erythematous areas, no cyanosis, seborrheic keratosis on trunk area  M/S:  Atraumatic, limited strength and range of motion in right lower extremity, no clubbing  Lymph nodes: Cervical, supraclavicular, and axillary nodes normal Neurologic: Grossly normal, cranial nerves II through XII intact, alert and oriented x 3, ambulates with a cane Psych: Appropriate affect   LABORATORY DATA: Lab Results  Component Value Date   WBC 6.4 04/08/2013   HGB 12.2 04/08/2013   HCT 37.9 04/08/2013   MCV 78.0* 04/08/2013   PLT 188 04/08/2013      Chemistry      Component Value Date/Time   NA 143 10/02/2011 1306   K 4.1 10/02/2011 1306   CL 104 10/02/2011 1306   CO2 33* 10/02/2011 1306   BUN 16 10/02/2011 1306   CREATININE 0.91 10/02/2011 1306      Component Value Date/Time   CALCIUM 9.6 10/02/2011 1306   ALKPHOS 54 10/02/2011 1306   AST 17 10/02/2011 1306   ALT 15 10/02/2011 1306   BILITOT 0.6 10/02/2011 1306       RADIOGRAPHIC STUDIES: Mm Digital Screening Unilat L 03/29/2013   CLINICAL DATA:  Screening.  EXAM: DIGITAL SCREENING UNILATERAL LEFT MAMMOGRAM WITH CAD  COMPARISON:  Previous exam(s).  ACR Breast Density Category c: The breasts are heterogeneously dense, which may obscure small masses.  FINDINGS: There are no  findings suspicious for malignancy. Images were processed with CAD.  IMPRESSION: No mammographic evidence of malignancy. A result letter of this screening mammogram will be mailed directly to the patient.  RECOMMENDATION: Screening mammogram in one year. (Code:SM-B-01Y)  BI-RADS CATEGORY  1: Negative   Electronically Signed   By: Baird Lyons M.D.   On: 03/29/2013 13:47     ASSESSMENT: Darlene Day is a 72 y.o.  female with a history of DCIS of the right breast, status post mastectomy. The tumor was estrogen receptor negative and progesterone receptor negative. She did not receive anti-estrogen therapy since her tumor was estrogen receptor negative.  Overall she is doing well and has no evidence of local recurrence in the ipsilateral scar area. No evidence of new disease in the contralateral breast.  Ms. Probasco says she experiences mild night sweats.   PLAN:  We plan to see Ms. Wolanski again in one  year at which time we will check laboratories of CBC and CMP.  She was encouraged to continue her current level of physical activity, try over-the-counter Peridin-C for night sweats, complete monthly breast self-examinations, and have her mammogram completed every year.  All questions answered.  Ms. Crooke and her daughter Melvenia Needles were encouraged to contact us in the interim with any questions, concerns, or problems.   Larina Bras, NP-C 04/11/2013    5:16 PM   ATTENDING'S ATTESTATION:  I personally reviewed patient's chart, examined patient myself, formulated the treatment plan as followed.    Patient has no complaints. She seems to be doing quite well. She is gong to see [place] back in one year for followup for her triple negative DCIS. She did undergo a mastectomy of the right breast.  Drue Second, MD Medical/Oncology [facility] [phone] (beeper) [phone] (Office)  04/12/2013, 5:03 PM

## 2013-04-08 NOTE — Patient Instructions (Addendum)
Please contact us at [phone] if you have any questions or concerns.  Please continue to do well and enjoy life!!!  Get plenty of rest, drink plenty of water, exercise daily (walking as tolerated), eat a balanced diet.  Keep taking vitamin D3 1000 IUs daily.   Try over the counter Peridin-C for night sweats  Complete monthly self-breast examinations.  Have a clinical breast exam by a physician every year.  Have your mammogram completed every year.  Results for orders placed in visit on 04/08/13 (from the past 24 hour(s))  CBC WITH DIFFERENTIAL     Status: Abnormal   Collection Time    04/08/13  2:13 PM      Result Value Range   WBC 6.4  3.9 - 10.3 10e3/uL   NEUT# 4.5  1.5 - 6.5 10e3/uL   HGB 12.2  11.6 - 15.9 g/dL   HCT 16.1  09.6 - 04.5 %   Platelets 188  145 - 400 10e3/uL   MCV 78.0 (*) 79.5 - 101.0 fL   MCH 25.1  25.1 - 34.0 pg   MCHC 32.1  31.5 - 36.0 g/dL   RBC 4.09  8.11 - 9.14 10e6/uL   RDW 16.4 (*) 11.2 - 14.5 %   lymph# 1.4  0.9 - 3.3 10e3/uL   MONO# 0.4  0.1 - 0.9 10e3/uL   Eosinophils Absolute 0.1  0.0 - 0.5 10e3/uL   Basophils Absolute 0.0  0.0 - 0.1 10e3/uL   NEUT% 70.3  38.4 - 76.8 %   LYMPH% 22.0  14.0 - 49.7 %   MONO% 6.2  0.0 - 14.0 %   EOS% 0.8  0.0 - 7.0 %   BASO% 0.7  0.0 - 2.0 %   Narrative:    Performed At:  [facility]               [address]               [place], [place] [address]

## 2013-04-13 ENCOUNTER — Encounter: Payer: Self-pay | Admitting: Family

## 2013-04-14 ENCOUNTER — Encounter: Payer: Self-pay | Admitting: Family

## 2013-04-15 ENCOUNTER — Encounter: Payer: Self-pay | Admitting: *Deleted

## 2013-04-26 DIAGNOSIS — H5200 Hypermetropia, unspecified eye: Secondary | ICD-10-CM | POA: Diagnosis not present

## 2013-04-26 DIAGNOSIS — H25099 Other age-related incipient cataract, unspecified eye: Secondary | ICD-10-CM | POA: Diagnosis not present

## 2013-04-26 DIAGNOSIS — H269 Unspecified cataract: Secondary | ICD-10-CM | POA: Diagnosis not present

## 2013-04-26 DIAGNOSIS — H524 Presbyopia: Secondary | ICD-10-CM | POA: Diagnosis not present

## 2013-05-13 ENCOUNTER — Encounter: Payer: Self-pay | Admitting: Family

## 2013-06-14 DIAGNOSIS — Z01419 Encounter for gynecological examination (general) (routine) without abnormal findings: Secondary | ICD-10-CM | POA: Diagnosis not present

## 2013-06-14 DIAGNOSIS — M899 Disorder of bone, unspecified: Secondary | ICD-10-CM | POA: Diagnosis not present

## 2013-06-14 DIAGNOSIS — M949 Disorder of cartilage, unspecified: Secondary | ICD-10-CM | POA: Diagnosis not present

## 2013-06-14 DIAGNOSIS — Z0000 Encounter for general adult medical examination without abnormal findings: Secondary | ICD-10-CM | POA: Diagnosis not present

## 2013-06-14 DIAGNOSIS — N951 Menopausal and female climacteric states: Secondary | ICD-10-CM | POA: Diagnosis not present

## 2013-06-14 DIAGNOSIS — D582 Other hemoglobinopathies: Secondary | ICD-10-CM | POA: Diagnosis not present

## 2013-12-08 DIAGNOSIS — Z0000 Encounter for general adult medical examination without abnormal findings: Secondary | ICD-10-CM | POA: Diagnosis not present

## 2013-12-08 DIAGNOSIS — M810 Age-related osteoporosis without current pathological fracture: Secondary | ICD-10-CM | POA: Diagnosis not present

## 2013-12-08 DIAGNOSIS — Z79899 Other long term (current) drug therapy: Secondary | ICD-10-CM | POA: Diagnosis not present

## 2014-01-11 DIAGNOSIS — Z0000 Encounter for general adult medical examination without abnormal findings: Secondary | ICD-10-CM | POA: Diagnosis not present

## 2014-01-11 DIAGNOSIS — M810 Age-related osteoporosis without current pathological fracture: Secondary | ICD-10-CM | POA: Diagnosis not present

## 2014-02-23 ENCOUNTER — Other Ambulatory Visit: Payer: Self-pay

## 2014-02-23 DIAGNOSIS — Z1231 Encounter for screening mammogram for malignant neoplasm of breast: Secondary | ICD-10-CM

## 2014-02-23 DIAGNOSIS — Z9011 Acquired absence of right breast and nipple: Secondary | ICD-10-CM

## 2014-02-25 ENCOUNTER — Telehealth: Payer: Self-pay | Admitting: Hematology and Oncology

## 2014-02-25 NOTE — Telephone Encounter (Signed)
s/w pt re new appt for lb/vg 12/15

## 2014-03-09 DIAGNOSIS — Z23 Encounter for immunization: Secondary | ICD-10-CM | POA: Diagnosis not present

## 2014-03-17 IMAGING — US US ABDOMEN COMPLETE
1 series · 14 of 25 positions shown · non-contrast
Comparison: 12/17/2011

CLINICAL DATA: Elevated liver enzymes.

COMPLETE ABDOMINAL ULTRASOUND

[Series 1: us abdomen complete · 0.20mm/px · 14 of 85 slices shown]
[im 1/85]
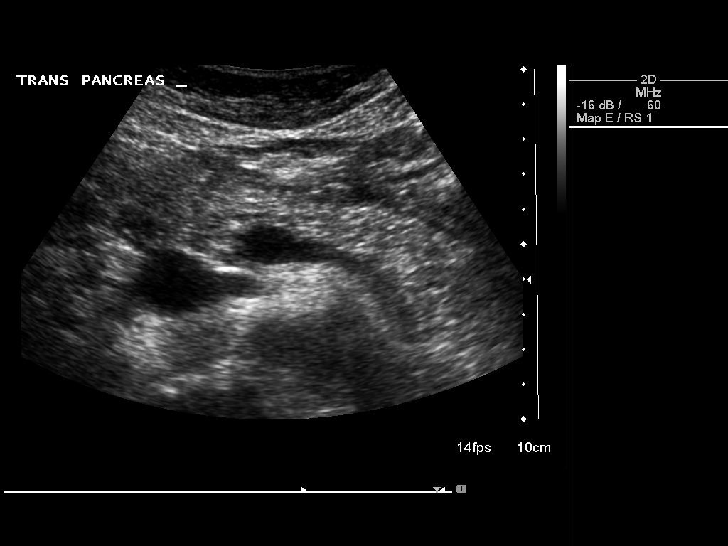
[im 8/85]
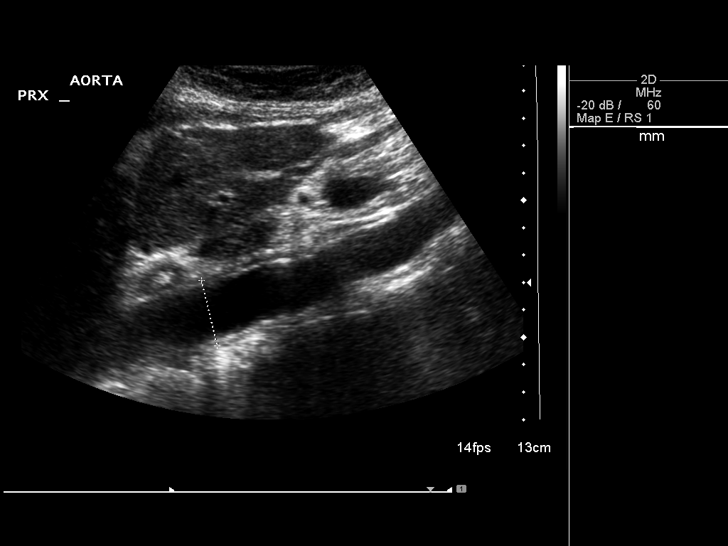
[im 15/85]
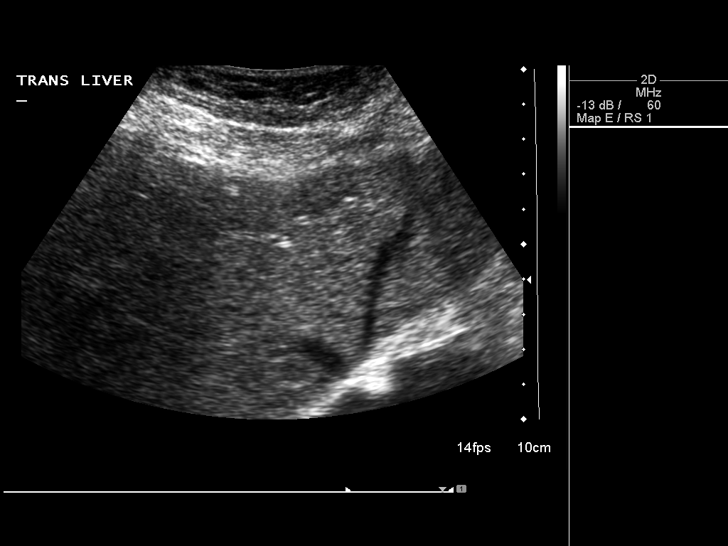
[im 22/85]
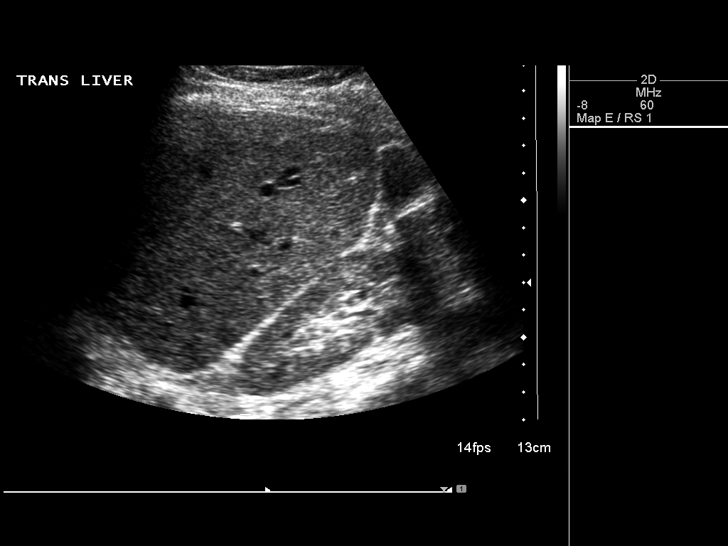
[im 29/85]
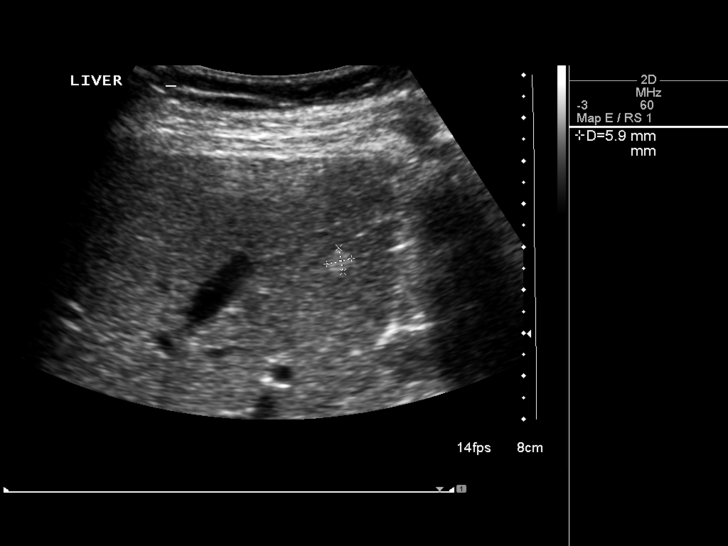
[im 32/85]
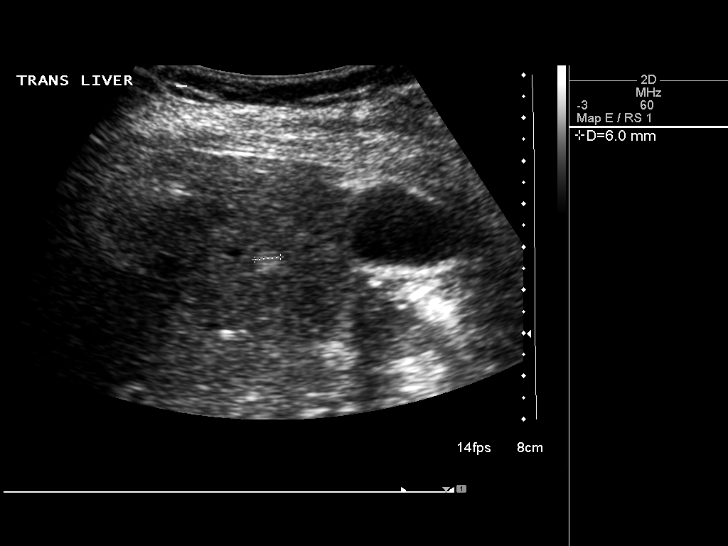
[im 39/85]
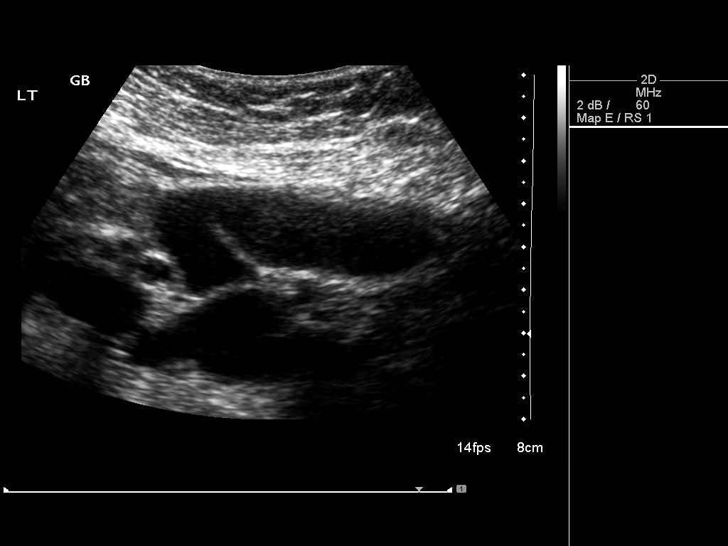
[im 46/85]
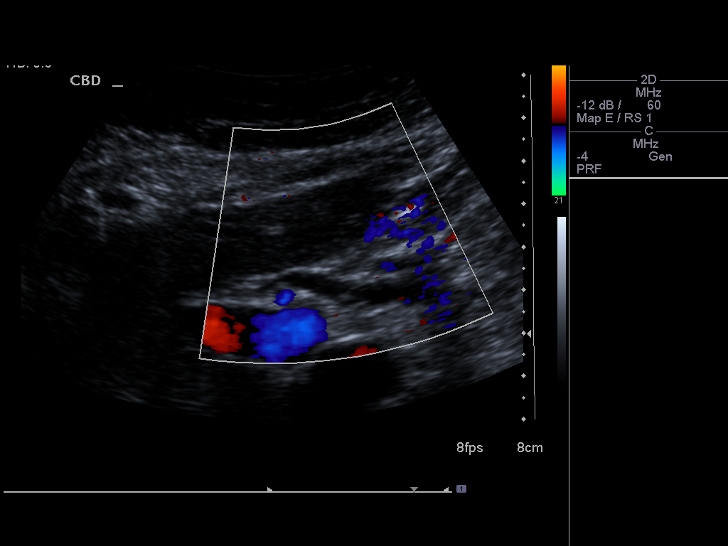
[im 53/85]
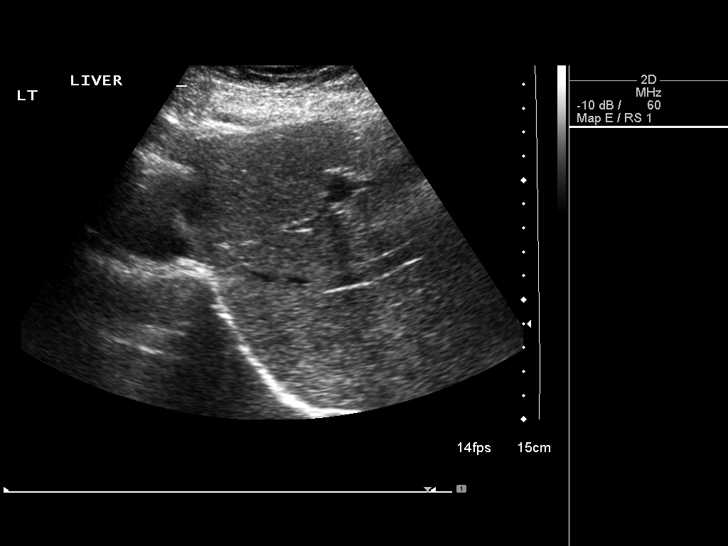
[im 57/85]
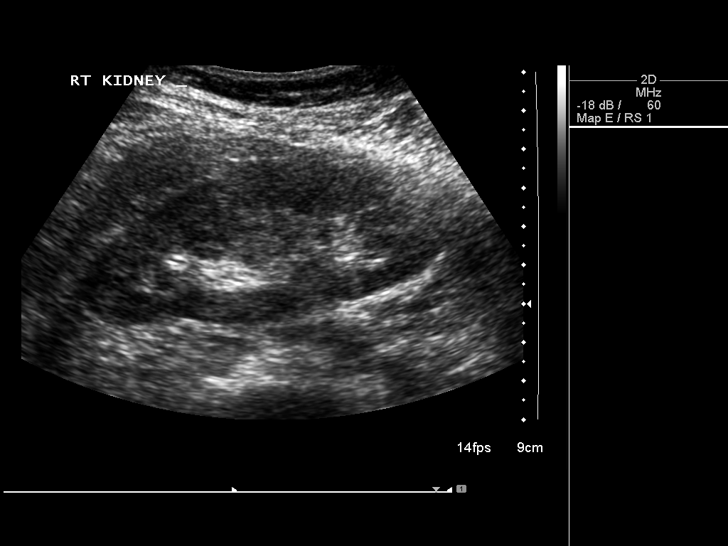
[im 64/85]
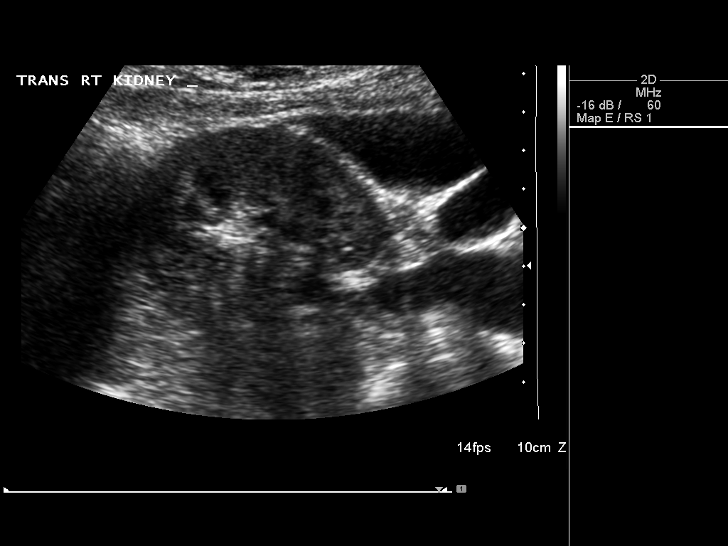
[im 71/85]
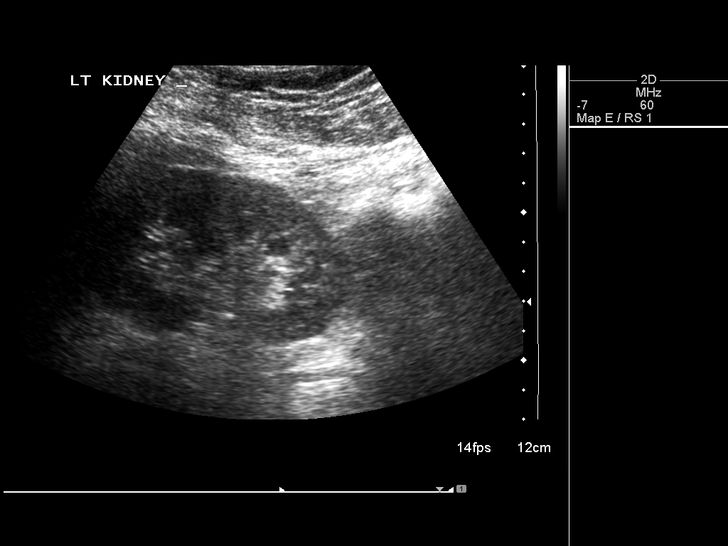
[im 78/85]
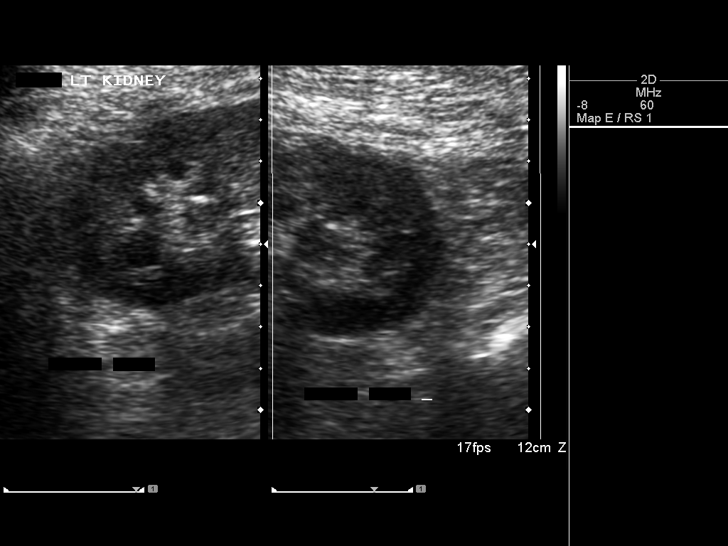
[im 85/85]
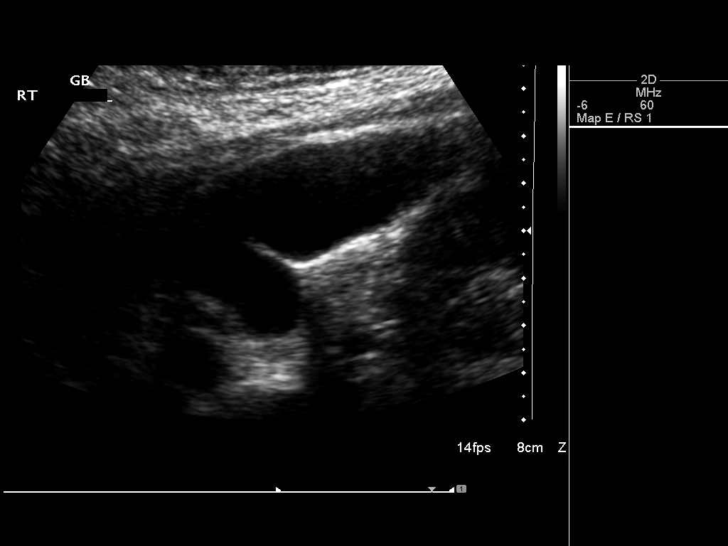

[14 of 25 positions shown; findings below may reference images not displayed]

FINDINGS: Gallbladder:  No gallstones, gallbladder wall thickening, or
pericholecystic fluid.

Common bile duct:  Normal in caliber measuring a maximum of 4.9mm.

Liver:  Normal echogenicity without biliary dilatation.  There is a
stable 6 mm hyperechoic lobulated lesion in the right hepatic lobe
consistent with a benign hemangioma.  A small calcification also
noted in the right hepatic lobe.

IVC:  Normal caliber.

Pancreas:  Sonographically normal.

Spleen:  Normal size and echogenicity without focal lesions.

Right Kidney:  9.8 cm in length. Normal renal cortical thickness
and echogenicity without focal lesions or hydronephrosis.

Left Kidney:  9.6 cm in length. Normal renal cortical thickness and
echogenicity without focal lesions or hydronephrosis.

Abdominal aorta:  Normal caliber.
IMPRESSION: 1.  Stable are 6 mm hyperechoic nodule in the liver consistent with
a benign hemangioma.
2.  Normal gallbladder and normal caliber common bile duct.

## 2014-03-30 ENCOUNTER — Ambulatory Visit
Admission: RE | Admit: 2014-03-30 | Discharge: 2014-03-30 | Disposition: A | Discharge status: Home / Self Care | Payer: Medicare Other | Source: Ambulatory Visit

## 2014-03-30 ENCOUNTER — Ambulatory Visit: Payer: Medicare Other

## 2014-03-30 DIAGNOSIS — Z1231 Encounter for screening mammogram for malignant neoplasm of breast: Secondary | ICD-10-CM

## 2014-03-30 DIAGNOSIS — Z9011 Acquired absence of right breast and nipple: Secondary | ICD-10-CM

## 2014-04-13 ENCOUNTER — Other Ambulatory Visit: Payer: Medicare Other

## 2014-04-13 ENCOUNTER — Ambulatory Visit: Payer: Medicare Other | Admitting: Oncology

## 2014-05-02 DIAGNOSIS — H25033 Anterior subcapsular polar age-related cataract, bilateral: Secondary | ICD-10-CM | POA: Diagnosis not present

## 2014-05-02 DIAGNOSIS — H2513 Age-related nuclear cataract, bilateral: Secondary | ICD-10-CM | POA: Diagnosis not present

## 2014-05-15 ENCOUNTER — Other Ambulatory Visit: Payer: Self-pay | Admitting: *Deleted

## 2014-05-15 DIAGNOSIS — H2511 Age-related nuclear cataract, right eye: Secondary | ICD-10-CM | POA: Diagnosis not present

## 2014-05-15 DIAGNOSIS — H3531 Nonexudative age-related macular degeneration: Secondary | ICD-10-CM | POA: Diagnosis not present

## 2014-05-15 DIAGNOSIS — H2512 Age-related nuclear cataract, left eye: Secondary | ICD-10-CM | POA: Diagnosis not present

## 2014-05-15 DIAGNOSIS — C50119 Malignant neoplasm of central portion of unspecified female breast: Secondary | ICD-10-CM

## 2014-05-15 DIAGNOSIS — H25011 Cortical age-related cataract, right eye: Secondary | ICD-10-CM | POA: Diagnosis not present

## 2014-05-15 DIAGNOSIS — H25012 Cortical age-related cataract, left eye: Secondary | ICD-10-CM | POA: Diagnosis not present

## 2014-05-16 ENCOUNTER — Other Ambulatory Visit (HOSPITAL_BASED_OUTPATIENT_CLINIC_OR_DEPARTMENT_OTHER): Payer: Medicare Other

## 2014-05-16 ENCOUNTER — Ambulatory Visit (HOSPITAL_BASED_OUTPATIENT_CLINIC_OR_DEPARTMENT_OTHER): Payer: Medicare Other | Admitting: Hematology and Oncology

## 2014-05-16 VITALS — BP 151/58 | HR 77 | Temp 98.0°F | Resp 18 | Ht 69.0 in | Wt 157.7 lb

## 2014-05-16 DIAGNOSIS — Z86000 Personal history of in-situ neoplasm of breast: Secondary | ICD-10-CM

## 2014-05-16 DIAGNOSIS — C50119 Malignant neoplasm of central portion of unspecified female breast: Secondary | ICD-10-CM

## 2014-05-16 DIAGNOSIS — C50111 Malignant neoplasm of central portion of right female breast: Secondary | ICD-10-CM

## 2014-05-16 LAB — CBC WITH DIFFERENTIAL/PLATELET
BASO%: 1.1 % (ref 0.0–2.0)
BASOS ABS: 0.1 10*3/uL (ref 0.0–0.1)
EOS%: 1.2 % (ref 0.0–7.0)
Eosinophils Absolute: 0.1 10*3/uL (ref 0.0–0.5)
HCT: 39.6 % (ref 34.8–46.6)
HEMOGLOBIN: 12.5 g/dL (ref 11.6–15.9)
LYMPH#: 1.2 10*3/uL (ref 0.9–3.3)
LYMPH%: 23.7 % (ref 14.0–49.7)
MCH: 25.7 pg (ref 25.1–34.0)
MCHC: 31.6 g/dL (ref 31.5–36.0)
MCV: 81.3 fL (ref 79.5–101.0)
MONO#: 0.5 10*3/uL (ref 0.1–0.9)
MONO%: 9.8 % (ref 0.0–14.0)
NEUT#: 3.3 10*3/uL (ref 1.5–6.5)
NEUT%: 64.2 % (ref 38.4–76.8)
Platelets: 187 10*3/uL (ref 145–400)
RBC: 4.87 10*6/uL (ref 3.70–5.45)
RDW: 13.7 % (ref 11.2–14.5)
WBC: 5.1 10*3/uL (ref 3.9–10.3)

## 2014-05-16 LAB — COMPREHENSIVE METABOLIC PANEL (CC13)
ALBUMIN: 3.7 g/dL (ref 3.5–5.0)
ALT: 20 U/L (ref 0–55)
AST: 21 U/L (ref 5–34)
Alkaline Phosphatase: 96 U/L (ref 40–150)
Anion Gap: 9 mEq/L (ref 3–11)
BUN: 17.7 mg/dL (ref 7.0–26.0)
CALCIUM: 9.6 mg/dL (ref 8.4–10.4)
CO2: 26 mEq/L (ref 22–29)
Chloride: 106 mEq/L (ref 98–109)
Creatinine: 0.9 mg/dL (ref 0.6–1.1)
EGFR: 68 mL/min/{1.73_m2} — ABNORMAL LOW (ref >90)
Glucose: 91 mg/dl (ref 70–140)
POTASSIUM: 4.3 meq/L (ref 3.5–5.1)
SODIUM: 140 meq/L (ref 136–145)
TOTAL PROTEIN: 7.1 g/dL (ref 6.4–8.3)
Total Bilirubin: 1.13 mg/dL (ref 0.20–1.20)

## 2014-05-16 NOTE — Progress Notes (Signed)
Patient Care Team: Jani Gravel, MD as PCP - General (Internal Medicine) Selinda Orion, MD (Obstetrics and Gynecology)  DIAGNOSIS: 73 year old female with DCIS of the right breast status post mastectomy with sentinel lymph node biopsy on 09/22/2011  PRIOR THERAPY:  #1 patient underwent a mastectomy on 09/22/2011 that revealed a ductal carcinoma in situ that was ER negative PR negative.  #2 patient has been on observation only.  CURRENT THERAPY: Observation  CHIEF COMPLIANT: Follow-up of DCIS  INTERVAL HISTORY: Darlene THRESHER is a 73 year old Caucasian with above-mentioned history of DCIS on the right breast treated with mastectomy. She is currently in observation without any new problems or concerns. She reports no new lumps or nodules in the breast. She underwent recent mammogram October 30 on the left breast which was normal.  REVIEW OF SYSTEMS:   Constitutional: Denies fevers, chills or abnormal weight loss Eyes: Denies blurriness of vision Ears, nose, mouth, throat, and face: Denies mucositis or sore throat Respiratory: Denies cough, dyspnea or wheezes Cardiovascular: Denies palpitation, chest discomfort or lower extremity swelling Gastrointestinal:  Denies nausea, heartburn or change in bowel habits Skin: Denies abnormal skin rashes Lymphatics: Denies new lymphadenopathy or easy bruising Neurological:Denies numbness, tingling or new weaknesses Behavioral/Psych: Mood is stable, no new changes  Breast:  denies any pain or lumps or nodules in either breasts All other systems were reviewed with the patient and are negative.  I have reviewed the past medical history, past surgical history, social history and family history with the patient and they are unchanged from previous note.  ALLERGIES:  is allergic to codeine.  MEDICATIONS:  Current Outpatient Prescriptions  Medication Sig Dispense Refill  . acetaminophen (TYLENOL) 500 MG tablet Take 1,000 mg by mouth every 6 (six)  hours as needed.    Marland Kitchen aspirin 325 MG EC tablet Take 325 mg by mouth daily.    . Cholecalciferol (VITAMIN D-3 PO) Take 1,000 Units by mouth 2 (two) times daily.     Marland Kitchen CINNAMON PO Take 1,000 mg by mouth 2 (two) times daily.    . citalopram (CELEXA) 10 MG tablet Take 10 mg by mouth daily.    Marland Kitchen Lysine 500 MG CAPS Take by mouth.    . Multiple Vitamin (MULTIVITAMIN) tablet Take 1 tablet by mouth daily.    . Psyllium (METAMUCIL PO) Take 1 Units by mouth daily. One tsp in [place] water daily    . vitamin C (ASCORBIC ACID) 500 MG tablet Take 500 mg by mouth daily.     No current facility-administered medications for this visit.    PHYSICAL EXAMINATION: ECOG PERFORMANCE STATUS: 0 - Asymptomatic  Filed Vitals:   05/16/14 1417  BP: 151/58  Pulse: 77  Temp: 98 F (36.7 C)  Resp: 18   Filed Weights   05/16/14 1417  Weight: 157 lb 11.2 oz (71.532 kg)    GENERAL:alert, no distress and comfortable SKIN: skin color, texture, turgor are normal, no rashes or significant lesions EYES: normal, Conjunctiva are pink and non-injected, sclera clear OROPHARYNX:no exudate, no erythema and lips, buccal mucosa, and tongue normal  NECK: supple, thyroid normal size, non-tender, without nodularity LYMPH:  no palpable lymphadenopathy in the cervical, axillary or inguinal LUNGS: clear to auscultation and percussion with normal breathing effort HEART: regular rate & rhythm and no murmurs and no lower extremity edema ABDOMEN:abdomen soft, non-tender and normal bowel sounds Musculoskeletal:no cyanosis of digits and no clubbing  NEURO: alert & oriented x 3 with fluent speech, no focal motor/sensory deficits BREAST:  No palpable masses or nodules in either left breast. No palpable axillary supraclavicular or infraclavicular adenopathy no breast tenderness or nipple discharge. Right chest wall and axilla are normal   LABORATORY DATA:  I have reviewed the data as listed   Chemistry      Component Value Date/Time    NA 140 05/16/2014 1349   NA 143 10/02/2011 1306   K 4.3 05/16/2014 1349   K 4.1 10/02/2011 1306   CL 104 10/02/2011 1306   CO2 26 05/16/2014 1349   CO2 33* 10/02/2011 1306   BUN 17.7 05/16/2014 1349   BUN 16 10/02/2011 1306   CREATININE 0.9 05/16/2014 1349   CREATININE 0.91 10/02/2011 1306      Component Value Date/Time   CALCIUM 9.6 05/16/2014 1349   CALCIUM 9.6 10/02/2011 1306   ALKPHOS 96 05/16/2014 1349   ALKPHOS 54 10/02/2011 1306   AST 21 05/16/2014 1349   AST 17 10/02/2011 1306   ALT 20 05/16/2014 1349   ALT 15 10/02/2011 1306   BILITOT 1.13 05/16/2014 1349   BILITOT 0.6 10/02/2011 1306       Lab Results  Component Value Date   WBC 5.1 05/16/2014   HGB 12.5 05/16/2014   HCT 39.6 05/16/2014   MCV 81.3 05/16/2014   PLT 187 05/16/2014   NEUTROABS 3.3 05/16/2014     RADIOGRAPHIC STUDIES: I have personally reviewed the radiology reports and agreed with their findings. Mammograms done 03/31/2014 on the left breast normal  ASSESSMENT & PLAN:  Cancer of central portion of right female breast Right breast DCIS status post mastectomy with sentinel lymph node biopsy 09/20/2011 ER negative PR negative. On observation  Surveillance: 1. Breast exam 05/16/2014 normal 2. Mammogram 03/31/2014 normal  Survivorship:Discussed the importance of physical exercise in decreasing the likelihood of breast cancer recurrence. Recommended 30 mins daily 6 days a week of either brisk walking or cycling or swimming. Encouraged patient to eat more fruits and vegetables and decrease red meat.   Patient would like to follow with her gynecologist and primary care physician for her breast cancer surveillance. I think it is reasonable and appropriate to do so. We are happy to assist her anytime if needed.   No orders of the defined types were placed in this encounter.   The patient has a good understanding of the overall plan. she agrees with it. She will call with any problems that may  develop before her next visit here.   Rulon Eisenmenger, MD 05/16/2014 2:44 PM

## 2014-05-16 NOTE — Assessment & Plan Note (Signed)
Right breast DCIS status post mastectomy with sentinel lymph node biopsy 09/20/2011 ER negative PR negative. On observation  Surveillance: 1. Breast exam 05/16/2014 normal 2. Mammogram 03/31/2014 normal  Survivorship:Discussed the importance of physical exercise in decreasing the likelihood of breast cancer recurrence. Recommended 30 mins daily 6 days a week of either brisk walking or cycling or swimming. Encouraged patient to eat more fruits and vegetables and decrease red meat.   Patient would like to follow with her gynecologist and primary care physician for her breast cancer surveillance. I think it is reasonable and appropriate to do so. We are happy to assist her anytime if needed.

## 2014-06-05 DIAGNOSIS — D0511 Intraductal carcinoma in situ of right breast: Secondary | ICD-10-CM | POA: Diagnosis not present

## 2014-07-06 DIAGNOSIS — Z01419 Encounter for gynecological examination (general) (routine) without abnormal findings: Secondary | ICD-10-CM | POA: Diagnosis not present

## 2014-07-06 DIAGNOSIS — M85829 Other specified disorders of bone density and structure, unspecified upper arm: Secondary | ICD-10-CM | POA: Diagnosis not present

## 2014-07-06 DIAGNOSIS — Z780 Asymptomatic menopausal state: Secondary | ICD-10-CM | POA: Diagnosis not present

## 2014-11-27 ENCOUNTER — Other Ambulatory Visit: Payer: Self-pay

## 2014-11-27 DIAGNOSIS — H25011 Cortical age-related cataract, right eye: Secondary | ICD-10-CM | POA: Diagnosis not present

## 2014-11-27 DIAGNOSIS — H2512 Age-related nuclear cataract, left eye: Secondary | ICD-10-CM | POA: Diagnosis not present

## 2014-11-27 DIAGNOSIS — H3531 Nonexudative age-related macular degeneration: Secondary | ICD-10-CM | POA: Diagnosis not present

## 2014-11-27 DIAGNOSIS — H2511 Age-related nuclear cataract, right eye: Secondary | ICD-10-CM | POA: Diagnosis not present

## 2014-11-27 DIAGNOSIS — H25012 Cortical age-related cataract, left eye: Secondary | ICD-10-CM | POA: Diagnosis not present

## 2015-01-09 DIAGNOSIS — Z79899 Other long term (current) drug therapy: Secondary | ICD-10-CM | POA: Diagnosis not present

## 2015-01-09 DIAGNOSIS — K219 Gastro-esophageal reflux disease without esophagitis: Secondary | ICD-10-CM | POA: Diagnosis not present

## 2015-01-09 DIAGNOSIS — M810 Age-related osteoporosis without current pathological fracture: Secondary | ICD-10-CM | POA: Diagnosis not present

## 2015-01-09 DIAGNOSIS — Z0000 Encounter for general adult medical examination without abnormal findings: Secondary | ICD-10-CM | POA: Diagnosis not present

## 2015-01-15 DIAGNOSIS — C50911 Malignant neoplasm of unspecified site of right female breast: Secondary | ICD-10-CM | POA: Diagnosis not present

## 2015-01-15 DIAGNOSIS — M810 Age-related osteoporosis without current pathological fracture: Secondary | ICD-10-CM | POA: Diagnosis not present

## 2015-01-15 DIAGNOSIS — Z0000 Encounter for general adult medical examination without abnormal findings: Secondary | ICD-10-CM | POA: Diagnosis not present

## 2015-01-15 DIAGNOSIS — K219 Gastro-esophageal reflux disease without esophagitis: Secondary | ICD-10-CM | POA: Diagnosis not present

## 2015-02-26 ENCOUNTER — Other Ambulatory Visit: Payer: Self-pay

## 2015-02-26 DIAGNOSIS — Z1231 Encounter for screening mammogram for malignant neoplasm of breast: Secondary | ICD-10-CM

## 2015-04-16 DIAGNOSIS — H2513 Age-related nuclear cataract, bilateral: Secondary | ICD-10-CM | POA: Diagnosis not present

## 2015-05-04 ENCOUNTER — Ambulatory Visit
Admission: RE | Admit: 2015-05-04 | Discharge: 2015-05-04 | Disposition: A | Discharge status: Home / Self Care | Payer: Medicare Other | Source: Ambulatory Visit

## 2015-05-04 DIAGNOSIS — Z1231 Encounter for screening mammogram for malignant neoplasm of breast: Secondary | ICD-10-CM | POA: Diagnosis not present

## 2015-08-08 DIAGNOSIS — H2511 Age-related nuclear cataract, right eye: Secondary | ICD-10-CM | POA: Diagnosis not present

## 2015-08-08 DIAGNOSIS — H25011 Cortical age-related cataract, right eye: Secondary | ICD-10-CM | POA: Diagnosis not present

## 2015-08-08 DIAGNOSIS — H35031 Hypertensive retinopathy, right eye: Secondary | ICD-10-CM | POA: Diagnosis not present

## 2015-08-08 DIAGNOSIS — H25012 Cortical age-related cataract, left eye: Secondary | ICD-10-CM | POA: Diagnosis not present

## 2015-08-08 DIAGNOSIS — H353112 Nonexudative age-related macular degeneration, right eye, intermediate dry stage: Secondary | ICD-10-CM | POA: Diagnosis not present

## 2015-08-08 DIAGNOSIS — H35032 Hypertensive retinopathy, left eye: Secondary | ICD-10-CM | POA: Diagnosis not present

## 2015-08-08 DIAGNOSIS — H353122 Nonexudative age-related macular degeneration, left eye, intermediate dry stage: Secondary | ICD-10-CM | POA: Diagnosis not present

## 2015-08-08 DIAGNOSIS — H2512 Age-related nuclear cataract, left eye: Secondary | ICD-10-CM | POA: Diagnosis not present

## 2015-09-12 ENCOUNTER — Other Ambulatory Visit: Payer: Self-pay | Admitting: Gynecology

## 2015-09-12 DIAGNOSIS — Z780 Asymptomatic menopausal state: Secondary | ICD-10-CM

## 2015-10-02 ENCOUNTER — Ambulatory Visit
Admission: RE | Admit: 2015-10-02 | Discharge: 2015-10-02 | Disposition: A | Discharge status: Home / Self Care | Payer: Medicare Other | Source: Ambulatory Visit | Attending: Gynecology | Admitting: Gynecology

## 2015-10-02 DIAGNOSIS — M810 Age-related osteoporosis without current pathological fracture: Secondary | ICD-10-CM | POA: Diagnosis not present

## 2015-10-02 DIAGNOSIS — Z780 Asymptomatic menopausal state: Secondary | ICD-10-CM

## 2015-11-01 DIAGNOSIS — Z6822 Body mass index (BMI) 22.0-22.9, adult: Secondary | ICD-10-CM | POA: Diagnosis not present

## 2015-11-01 DIAGNOSIS — Z780 Asymptomatic menopausal state: Secondary | ICD-10-CM | POA: Diagnosis not present

## 2015-11-01 DIAGNOSIS — Z01419 Encounter for gynecological examination (general) (routine) without abnormal findings: Secondary | ICD-10-CM | POA: Diagnosis not present

## 2015-11-01 DIAGNOSIS — Z853 Personal history of malignant neoplasm of breast: Secondary | ICD-10-CM | POA: Diagnosis not present

## 2015-11-19 DIAGNOSIS — L570 Actinic keratosis: Secondary | ICD-10-CM | POA: Diagnosis not present

## 2015-11-19 DIAGNOSIS — D1801 Hemangioma of skin and subcutaneous tissue: Secondary | ICD-10-CM | POA: Diagnosis not present

## 2015-11-19 DIAGNOSIS — L821 Other seborrheic keratosis: Secondary | ICD-10-CM | POA: Diagnosis not present

## 2015-11-19 DIAGNOSIS — D235 Other benign neoplasm of skin of trunk: Secondary | ICD-10-CM | POA: Diagnosis not present

## 2015-11-20 DIAGNOSIS — M79672 Pain in left foot: Secondary | ICD-10-CM | POA: Diagnosis not present

## 2015-11-26 DIAGNOSIS — M25572 Pain in left ankle and joints of left foot: Secondary | ICD-10-CM | POA: Diagnosis not present

## 2015-11-27 DIAGNOSIS — T7840XA Allergy, unspecified, initial encounter: Secondary | ICD-10-CM | POA: Diagnosis not present

## 2015-11-27 DIAGNOSIS — M810 Age-related osteoporosis without current pathological fracture: Secondary | ICD-10-CM | POA: Diagnosis not present

## 2015-12-14 ENCOUNTER — Encounter: Payer: Self-pay | Admitting: Genetic Counselor

## 2016-01-16 DIAGNOSIS — Z79899 Other long term (current) drug therapy: Secondary | ICD-10-CM | POA: Diagnosis not present

## 2016-01-16 DIAGNOSIS — M810 Age-related osteoporosis without current pathological fracture: Secondary | ICD-10-CM | POA: Diagnosis not present

## 2016-01-16 DIAGNOSIS — E559 Vitamin D deficiency, unspecified: Secondary | ICD-10-CM | POA: Diagnosis not present

## 2016-01-16 DIAGNOSIS — K219 Gastro-esophageal reflux disease without esophagitis: Secondary | ICD-10-CM | POA: Diagnosis not present

## 2016-01-22 DIAGNOSIS — Z0000 Encounter for general adult medical examination without abnormal findings: Secondary | ICD-10-CM | POA: Diagnosis not present

## 2016-01-22 DIAGNOSIS — M79672 Pain in left foot: Secondary | ICD-10-CM | POA: Diagnosis not present

## 2016-01-22 DIAGNOSIS — M810 Age-related osteoporosis without current pathological fracture: Secondary | ICD-10-CM | POA: Diagnosis not present

## 2016-02-01 ENCOUNTER — Other Ambulatory Visit: Payer: Self-pay

## 2016-03-30 DIAGNOSIS — Z23 Encounter for immunization: Secondary | ICD-10-CM | POA: Diagnosis not present

## 2016-03-31 ENCOUNTER — Other Ambulatory Visit: Payer: Self-pay | Admitting: Internal Medicine

## 2016-03-31 DIAGNOSIS — Z1231 Encounter for screening mammogram for malignant neoplasm of breast: Secondary | ICD-10-CM

## 2016-04-10 DIAGNOSIS — F419 Anxiety disorder, unspecified: Secondary | ICD-10-CM | POA: Diagnosis not present

## 2016-04-16 DIAGNOSIS — H2513 Age-related nuclear cataract, bilateral: Secondary | ICD-10-CM | POA: Diagnosis not present

## 2016-04-16 DIAGNOSIS — H353131 Nonexudative age-related macular degeneration, bilateral, early dry stage: Secondary | ICD-10-CM | POA: Diagnosis not present

## 2016-05-05 ENCOUNTER — Ambulatory Visit
Admission: RE | Admit: 2016-05-05 | Discharge: 2016-05-05 | Disposition: A | Discharge status: Home / Self Care | Payer: Medicare Other | Source: Ambulatory Visit | Attending: Internal Medicine | Admitting: Internal Medicine

## 2016-05-05 DIAGNOSIS — Z1231 Encounter for screening mammogram for malignant neoplasm of breast: Secondary | ICD-10-CM

## 2016-05-07 DIAGNOSIS — F419 Anxiety disorder, unspecified: Secondary | ICD-10-CM | POA: Diagnosis not present

## 2016-05-07 DIAGNOSIS — Z0000 Encounter for general adult medical examination without abnormal findings: Secondary | ICD-10-CM | POA: Diagnosis not present

## 2016-11-20 DIAGNOSIS — Z01419 Encounter for gynecological examination (general) (routine) without abnormal findings: Secondary | ICD-10-CM | POA: Diagnosis not present

## 2016-11-20 DIAGNOSIS — Z853 Personal history of malignant neoplasm of breast: Secondary | ICD-10-CM | POA: Diagnosis not present

## 2016-11-20 DIAGNOSIS — Z780 Asymptomatic menopausal state: Secondary | ICD-10-CM | POA: Diagnosis not present

## 2016-11-27 ENCOUNTER — Other Ambulatory Visit: Payer: Self-pay | Admitting: Dermatology

## 2016-11-27 DIAGNOSIS — L821 Other seborrheic keratosis: Secondary | ICD-10-CM | POA: Diagnosis not present

## 2016-11-27 DIAGNOSIS — D485 Neoplasm of uncertain behavior of skin: Secondary | ICD-10-CM | POA: Diagnosis not present

## 2016-11-27 DIAGNOSIS — D2262 Melanocytic nevi of left upper limb, including shoulder: Secondary | ICD-10-CM | POA: Diagnosis not present

## 2016-11-27 DIAGNOSIS — D225 Melanocytic nevi of trunk: Secondary | ICD-10-CM | POA: Diagnosis not present

## 2016-12-09 ENCOUNTER — Other Ambulatory Visit: Payer: Self-pay | Admitting: Dermatology

## 2016-12-09 DIAGNOSIS — L080 Pyoderma: Secondary | ICD-10-CM | POA: Diagnosis not present

## 2016-12-09 DIAGNOSIS — D485 Neoplasm of uncertain behavior of skin: Secondary | ICD-10-CM | POA: Diagnosis not present

## 2016-12-09 DIAGNOSIS — D2262 Melanocytic nevi of left upper limb, including shoulder: Secondary | ICD-10-CM | POA: Diagnosis not present

## 2017-01-21 DIAGNOSIS — Z79899 Other long term (current) drug therapy: Secondary | ICD-10-CM | POA: Diagnosis not present

## 2017-01-21 DIAGNOSIS — K219 Gastro-esophageal reflux disease without esophagitis: Secondary | ICD-10-CM | POA: Diagnosis not present

## 2017-01-21 DIAGNOSIS — M810 Age-related osteoporosis without current pathological fracture: Secondary | ICD-10-CM | POA: Diagnosis not present

## 2017-01-21 DIAGNOSIS — E559 Vitamin D deficiency, unspecified: Secondary | ICD-10-CM | POA: Diagnosis not present

## 2017-01-26 DIAGNOSIS — Z23 Encounter for immunization: Secondary | ICD-10-CM | POA: Diagnosis not present

## 2017-01-26 DIAGNOSIS — M810 Age-related osteoporosis without current pathological fracture: Secondary | ICD-10-CM | POA: Diagnosis not present

## 2017-01-26 DIAGNOSIS — E559 Vitamin D deficiency, unspecified: Secondary | ICD-10-CM | POA: Diagnosis not present

## 2017-01-26 DIAGNOSIS — K219 Gastro-esophageal reflux disease without esophagitis: Secondary | ICD-10-CM | POA: Diagnosis not present

## 2017-02-24 DIAGNOSIS — Z23 Encounter for immunization: Secondary | ICD-10-CM | POA: Diagnosis not present

## 2017-02-26 DIAGNOSIS — L910 Hypertrophic scar: Secondary | ICD-10-CM | POA: Diagnosis not present

## 2017-02-26 DIAGNOSIS — L820 Inflamed seborrheic keratosis: Secondary | ICD-10-CM | POA: Diagnosis not present

## 2017-03-24 ENCOUNTER — Other Ambulatory Visit: Payer: Self-pay | Admitting: Internal Medicine

## 2017-03-24 DIAGNOSIS — Z1231 Encounter for screening mammogram for malignant neoplasm of breast: Secondary | ICD-10-CM

## 2017-04-27 DIAGNOSIS — H2513 Age-related nuclear cataract, bilateral: Secondary | ICD-10-CM | POA: Diagnosis not present

## 2017-04-27 DIAGNOSIS — H3530 Unspecified macular degeneration: Secondary | ICD-10-CM | POA: Diagnosis not present

## 2017-05-06 ENCOUNTER — Ambulatory Visit
Admission: RE | Admit: 2017-05-06 | Discharge: 2017-05-06 | Disposition: A | Discharge status: Home / Self Care | Payer: Medicare Other | Source: Ambulatory Visit | Attending: Internal Medicine | Admitting: Internal Medicine

## 2017-05-06 DIAGNOSIS — Z1231 Encounter for screening mammogram for malignant neoplasm of breast: Secondary | ICD-10-CM | POA: Diagnosis not present

## 2017-06-23 DIAGNOSIS — Z86010 Personal history of colonic polyps: Secondary | ICD-10-CM | POA: Diagnosis not present

## 2017-06-23 DIAGNOSIS — K5730 Diverticulosis of large intestine without perforation or abscess without bleeding: Secondary | ICD-10-CM | POA: Diagnosis not present

## 2017-11-26 DIAGNOSIS — D1801 Hemangioma of skin and subcutaneous tissue: Secondary | ICD-10-CM | POA: Diagnosis not present

## 2017-11-26 DIAGNOSIS — L814 Other melanin hyperpigmentation: Secondary | ICD-10-CM | POA: Diagnosis not present

## 2017-11-26 DIAGNOSIS — L905 Scar conditions and fibrosis of skin: Secondary | ICD-10-CM | POA: Diagnosis not present

## 2017-11-26 DIAGNOSIS — D229 Melanocytic nevi, unspecified: Secondary | ICD-10-CM | POA: Diagnosis not present

## 2017-11-26 DIAGNOSIS — L821 Other seborrheic keratosis: Secondary | ICD-10-CM | POA: Diagnosis not present

## 2017-12-10 DIAGNOSIS — Z01419 Encounter for gynecological examination (general) (routine) without abnormal findings: Secondary | ICD-10-CM | POA: Diagnosis not present

## 2017-12-10 DIAGNOSIS — Z853 Personal history of malignant neoplasm of breast: Secondary | ICD-10-CM | POA: Diagnosis not present

## 2017-12-10 DIAGNOSIS — Z780 Asymptomatic menopausal state: Secondary | ICD-10-CM | POA: Diagnosis not present

## 2018-01-20 DIAGNOSIS — E559 Vitamin D deficiency, unspecified: Secondary | ICD-10-CM | POA: Diagnosis not present

## 2018-01-20 DIAGNOSIS — K219 Gastro-esophageal reflux disease without esophagitis: Secondary | ICD-10-CM | POA: Diagnosis not present

## 2018-01-20 DIAGNOSIS — Z79899 Other long term (current) drug therapy: Secondary | ICD-10-CM | POA: Diagnosis not present

## 2018-01-27 DIAGNOSIS — M810 Age-related osteoporosis without current pathological fracture: Secondary | ICD-10-CM | POA: Diagnosis not present

## 2018-01-27 DIAGNOSIS — M8000XG Age-related osteoporosis with current pathological fracture, unspecified site, subsequent encounter for fracture with delayed healing: Secondary | ICD-10-CM | POA: Diagnosis not present

## 2018-01-27 DIAGNOSIS — F419 Anxiety disorder, unspecified: Secondary | ICD-10-CM | POA: Diagnosis not present

## 2018-01-27 DIAGNOSIS — Z23 Encounter for immunization: Secondary | ICD-10-CM | POA: Diagnosis not present

## 2018-01-27 DIAGNOSIS — Z0000 Encounter for general adult medical examination without abnormal findings: Secondary | ICD-10-CM | POA: Diagnosis not present

## 2018-01-27 DIAGNOSIS — K219 Gastro-esophageal reflux disease without esophagitis: Secondary | ICD-10-CM | POA: Diagnosis not present

## 2018-03-29 DIAGNOSIS — M810 Age-related osteoporosis without current pathological fracture: Secondary | ICD-10-CM | POA: Diagnosis not present

## 2018-04-21 DIAGNOSIS — H25813 Combined forms of age-related cataract, bilateral: Secondary | ICD-10-CM | POA: Diagnosis not present

## 2018-04-21 DIAGNOSIS — H5203 Hypermetropia, bilateral: Secondary | ICD-10-CM | POA: Diagnosis not present

## 2018-04-21 DIAGNOSIS — H524 Presbyopia: Secondary | ICD-10-CM | POA: Diagnosis not present

## 2018-05-17 ENCOUNTER — Other Ambulatory Visit: Payer: Self-pay | Admitting: Internal Medicine

## 2018-05-17 DIAGNOSIS — Z1231 Encounter for screening mammogram for malignant neoplasm of breast: Secondary | ICD-10-CM

## 2018-06-22 ENCOUNTER — Ambulatory Visit
Admission: RE | Admit: 2018-06-22 | Discharge: 2018-06-22 | Disposition: A | Discharge status: Home / Self Care | Payer: Medicare Other | Source: Ambulatory Visit | Attending: Internal Medicine | Admitting: Internal Medicine

## 2018-06-22 DIAGNOSIS — Z1231 Encounter for screening mammogram for malignant neoplasm of breast: Secondary | ICD-10-CM | POA: Diagnosis not present

## 2018-12-30 ENCOUNTER — Other Ambulatory Visit: Payer: Self-pay

## 2019-01-24 DIAGNOSIS — E785 Hyperlipidemia, unspecified: Secondary | ICD-10-CM | POA: Diagnosis not present

## 2019-01-24 DIAGNOSIS — I10 Essential (primary) hypertension: Secondary | ICD-10-CM | POA: Diagnosis not present

## 2019-01-24 DIAGNOSIS — Z0000 Encounter for general adult medical examination without abnormal findings: Secondary | ICD-10-CM | POA: Diagnosis not present

## 2019-01-24 DIAGNOSIS — E039 Hypothyroidism, unspecified: Secondary | ICD-10-CM | POA: Diagnosis not present

## 2019-01-24 DIAGNOSIS — E559 Vitamin D deficiency, unspecified: Secondary | ICD-10-CM | POA: Diagnosis not present

## 2019-02-01 DIAGNOSIS — Z23 Encounter for immunization: Secondary | ICD-10-CM | POA: Diagnosis not present

## 2019-02-01 DIAGNOSIS — Z0000 Encounter for general adult medical examination without abnormal findings: Secondary | ICD-10-CM | POA: Diagnosis not present

## 2019-02-01 DIAGNOSIS — E559 Vitamin D deficiency, unspecified: Secondary | ICD-10-CM | POA: Diagnosis not present

## 2019-02-01 DIAGNOSIS — F419 Anxiety disorder, unspecified: Secondary | ICD-10-CM | POA: Diagnosis not present

## 2019-03-23 DIAGNOSIS — Z01419 Encounter for gynecological examination (general) (routine) without abnormal findings: Secondary | ICD-10-CM | POA: Diagnosis not present

## 2019-03-23 DIAGNOSIS — Z853 Personal history of malignant neoplasm of breast: Secondary | ICD-10-CM | POA: Diagnosis not present

## 2019-05-20 ENCOUNTER — Other Ambulatory Visit: Payer: Self-pay | Admitting: Internal Medicine

## 2019-05-20 DIAGNOSIS — Z1231 Encounter for screening mammogram for malignant neoplasm of breast: Secondary | ICD-10-CM

## 2019-07-07 ENCOUNTER — Ambulatory Visit
Admission: RE | Admit: 2019-07-07 | Discharge: 2019-07-07 | Disposition: A | Discharge status: Home / Self Care | Payer: Medicare Other | Source: Ambulatory Visit | Attending: Internal Medicine | Admitting: Internal Medicine

## 2019-07-07 ENCOUNTER — Other Ambulatory Visit: Payer: Self-pay

## 2019-07-07 DIAGNOSIS — Z1231 Encounter for screening mammogram for malignant neoplasm of breast: Secondary | ICD-10-CM

## 2019-09-06 DIAGNOSIS — S0181XA Laceration without foreign body of other part of head, initial encounter: Secondary | ICD-10-CM | POA: Diagnosis not present

## 2019-09-06 DIAGNOSIS — Z9011 Acquired absence of right breast and nipple: Secondary | ICD-10-CM | POA: Diagnosis not present

## 2019-09-06 DIAGNOSIS — F419 Anxiety disorder, unspecified: Secondary | ICD-10-CM | POA: Diagnosis not present

## 2019-09-06 DIAGNOSIS — S40212A Abrasion of left shoulder, initial encounter: Secondary | ICD-10-CM | POA: Diagnosis not present

## 2019-09-06 DIAGNOSIS — Z79899 Other long term (current) drug therapy: Secondary | ICD-10-CM | POA: Diagnosis not present

## 2019-09-06 DIAGNOSIS — Z7982 Long term (current) use of aspirin: Secondary | ICD-10-CM | POA: Diagnosis not present

## 2019-09-06 DIAGNOSIS — S0990XA Unspecified injury of head, initial encounter: Secondary | ICD-10-CM | POA: Diagnosis not present

## 2019-09-06 DIAGNOSIS — S01112A Laceration without foreign body of left eyelid and periocular area, initial encounter: Secondary | ICD-10-CM | POA: Diagnosis not present

## 2019-09-06 DIAGNOSIS — Z23 Encounter for immunization: Secondary | ICD-10-CM | POA: Diagnosis not present

## 2019-10-05 DIAGNOSIS — H35363 Drusen (degenerative) of macula, bilateral: Secondary | ICD-10-CM | POA: Diagnosis not present

## 2019-10-05 DIAGNOSIS — H5203 Hypermetropia, bilateral: Secondary | ICD-10-CM | POA: Diagnosis not present

## 2019-10-05 DIAGNOSIS — H524 Presbyopia: Secondary | ICD-10-CM | POA: Diagnosis not present

## 2019-10-05 DIAGNOSIS — H2513 Age-related nuclear cataract, bilateral: Secondary | ICD-10-CM | POA: Diagnosis not present

## 2020-01-13 ENCOUNTER — Encounter: Payer: Self-pay | Admitting: Genetic Counselor

## 2020-01-26 DIAGNOSIS — Z0000 Encounter for general adult medical examination without abnormal findings: Secondary | ICD-10-CM | POA: Diagnosis not present

## 2020-01-26 DIAGNOSIS — F419 Anxiety disorder, unspecified: Secondary | ICD-10-CM | POA: Diagnosis not present

## 2020-01-26 DIAGNOSIS — E039 Hypothyroidism, unspecified: Secondary | ICD-10-CM | POA: Diagnosis not present

## 2020-01-26 DIAGNOSIS — I10 Essential (primary) hypertension: Secondary | ICD-10-CM | POA: Diagnosis not present

## 2020-01-26 DIAGNOSIS — E559 Vitamin D deficiency, unspecified: Secondary | ICD-10-CM | POA: Diagnosis not present

## 2020-02-14 DIAGNOSIS — E559 Vitamin D deficiency, unspecified: Secondary | ICD-10-CM | POA: Diagnosis not present

## 2020-02-14 DIAGNOSIS — R0781 Pleurodynia: Secondary | ICD-10-CM | POA: Diagnosis not present

## 2020-02-14 DIAGNOSIS — R0789 Other chest pain: Secondary | ICD-10-CM | POA: Diagnosis not present

## 2020-02-14 DIAGNOSIS — F419 Anxiety disorder, unspecified: Secondary | ICD-10-CM | POA: Diagnosis not present

## 2020-02-14 DIAGNOSIS — Z23 Encounter for immunization: Secondary | ICD-10-CM | POA: Diagnosis not present

## 2020-02-14 DIAGNOSIS — M810 Age-related osteoporosis without current pathological fracture: Secondary | ICD-10-CM | POA: Diagnosis not present

## 2020-02-14 DIAGNOSIS — Z0000 Encounter for general adult medical examination without abnormal findings: Secondary | ICD-10-CM | POA: Diagnosis not present

## 2020-03-16 DIAGNOSIS — Z23 Encounter for immunization: Secondary | ICD-10-CM | POA: Diagnosis not present

## 2020-03-27 DIAGNOSIS — Z853 Personal history of malignant neoplasm of breast: Secondary | ICD-10-CM | POA: Diagnosis not present

## 2020-03-27 DIAGNOSIS — Z01419 Encounter for gynecological examination (general) (routine) without abnormal findings: Secondary | ICD-10-CM | POA: Diagnosis not present

## 2020-05-29 ENCOUNTER — Other Ambulatory Visit: Payer: Self-pay | Admitting: Internal Medicine

## 2020-05-29 DIAGNOSIS — Z1231 Encounter for screening mammogram for malignant neoplasm of breast: Secondary | ICD-10-CM

## 2020-07-03 DIAGNOSIS — D229 Melanocytic nevi, unspecified: Secondary | ICD-10-CM | POA: Diagnosis not present

## 2020-07-03 DIAGNOSIS — L905 Scar conditions and fibrosis of skin: Secondary | ICD-10-CM | POA: Diagnosis not present

## 2020-07-03 DIAGNOSIS — I8393 Asymptomatic varicose veins of bilateral lower extremities: Secondary | ICD-10-CM | POA: Diagnosis not present

## 2020-07-03 DIAGNOSIS — L821 Other seborrheic keratosis: Secondary | ICD-10-CM | POA: Diagnosis not present

## 2020-07-03 DIAGNOSIS — L570 Actinic keratosis: Secondary | ICD-10-CM | POA: Diagnosis not present

## 2020-07-03 DIAGNOSIS — Z872 Personal history of diseases of the skin and subcutaneous tissue: Secondary | ICD-10-CM | POA: Diagnosis not present

## 2020-07-03 DIAGNOSIS — L814 Other melanin hyperpigmentation: Secondary | ICD-10-CM | POA: Diagnosis not present

## 2020-07-03 DIAGNOSIS — D1801 Hemangioma of skin and subcutaneous tissue: Secondary | ICD-10-CM | POA: Diagnosis not present

## 2020-07-03 DIAGNOSIS — L819 Disorder of pigmentation, unspecified: Secondary | ICD-10-CM | POA: Diagnosis not present

## 2020-07-09 ENCOUNTER — Other Ambulatory Visit: Payer: Self-pay

## 2020-07-09 ENCOUNTER — Other Ambulatory Visit: Payer: Self-pay | Admitting: Internal Medicine

## 2020-07-09 ENCOUNTER — Ambulatory Visit
Admission: RE | Admit: 2020-07-09 | Discharge: 2020-07-09 | Disposition: A | Discharge status: Home / Self Care | Payer: Medicare Other | Source: Ambulatory Visit | Attending: Internal Medicine | Admitting: Internal Medicine

## 2020-07-09 DIAGNOSIS — Z1231 Encounter for screening mammogram for malignant neoplasm of breast: Secondary | ICD-10-CM

## 2020-07-12 ENCOUNTER — Other Ambulatory Visit: Payer: Self-pay | Admitting: Internal Medicine

## 2020-07-12 DIAGNOSIS — R928 Other abnormal and inconclusive findings on diagnostic imaging of breast: Secondary | ICD-10-CM

## 2020-07-25 ENCOUNTER — Other Ambulatory Visit: Payer: Self-pay

## 2020-07-25 ENCOUNTER — Ambulatory Visit
Admission: RE | Admit: 2020-07-25 | Discharge: 2020-07-25 | Disposition: A | Discharge status: Home / Self Care | Payer: Medicare Other | Source: Ambulatory Visit | Attending: Internal Medicine | Admitting: Internal Medicine

## 2020-07-25 DIAGNOSIS — R928 Other abnormal and inconclusive findings on diagnostic imaging of breast: Secondary | ICD-10-CM

## 2020-07-25 DIAGNOSIS — N6322 Unspecified lump in the left breast, upper inner quadrant: Secondary | ICD-10-CM | POA: Diagnosis not present

## 2020-07-25 DIAGNOSIS — R922 Inconclusive mammogram: Secondary | ICD-10-CM | POA: Diagnosis not present

## 2020-07-25 DIAGNOSIS — N6002 Solitary cyst of left breast: Secondary | ICD-10-CM | POA: Diagnosis not present

## 2020-12-24 DIAGNOSIS — H52223 Regular astigmatism, bilateral: Secondary | ICD-10-CM | POA: Diagnosis not present

## 2020-12-24 DIAGNOSIS — H524 Presbyopia: Secondary | ICD-10-CM | POA: Diagnosis not present

## 2020-12-24 DIAGNOSIS — H353130 Nonexudative age-related macular degeneration, bilateral, stage unspecified: Secondary | ICD-10-CM | POA: Diagnosis not present

## 2020-12-24 DIAGNOSIS — Z135 Encounter for screening for eye and ear disorders: Secondary | ICD-10-CM | POA: Diagnosis not present

## 2020-12-24 DIAGNOSIS — H2513 Age-related nuclear cataract, bilateral: Secondary | ICD-10-CM | POA: Diagnosis not present

## 2020-12-24 DIAGNOSIS — H5203 Hypermetropia, bilateral: Secondary | ICD-10-CM | POA: Diagnosis not present

## 2021-02-13 DIAGNOSIS — E039 Hypothyroidism, unspecified: Secondary | ICD-10-CM | POA: Diagnosis not present

## 2021-02-13 DIAGNOSIS — I10 Essential (primary) hypertension: Secondary | ICD-10-CM | POA: Diagnosis not present

## 2021-02-13 DIAGNOSIS — Z0000 Encounter for general adult medical examination without abnormal findings: Secondary | ICD-10-CM | POA: Diagnosis not present

## 2021-02-13 DIAGNOSIS — E785 Hyperlipidemia, unspecified: Secondary | ICD-10-CM | POA: Diagnosis not present

## 2021-02-13 DIAGNOSIS — E559 Vitamin D deficiency, unspecified: Secondary | ICD-10-CM | POA: Diagnosis not present

## 2021-02-20 DIAGNOSIS — Z853 Personal history of malignant neoplasm of breast: Secondary | ICD-10-CM | POA: Diagnosis not present

## 2021-02-20 DIAGNOSIS — R7989 Other specified abnormal findings of blood chemistry: Secondary | ICD-10-CM | POA: Diagnosis not present

## 2021-02-20 DIAGNOSIS — Z0000 Encounter for general adult medical examination without abnormal findings: Secondary | ICD-10-CM | POA: Diagnosis not present

## 2021-02-20 DIAGNOSIS — Z23 Encounter for immunization: Secondary | ICD-10-CM | POA: Diagnosis not present

## 2021-02-20 DIAGNOSIS — E785 Hyperlipidemia, unspecified: Secondary | ICD-10-CM | POA: Diagnosis not present

## 2021-02-20 DIAGNOSIS — R7309 Other abnormal glucose: Secondary | ICD-10-CM | POA: Diagnosis not present

## 2021-02-20 DIAGNOSIS — Z79899 Other long term (current) drug therapy: Secondary | ICD-10-CM | POA: Diagnosis not present

## 2021-02-20 DIAGNOSIS — F419 Anxiety disorder, unspecified: Secondary | ICD-10-CM | POA: Diagnosis not present

## 2021-02-20 DIAGNOSIS — M810 Age-related osteoporosis without current pathological fracture: Secondary | ICD-10-CM | POA: Diagnosis not present

## 2021-02-20 DIAGNOSIS — E559 Vitamin D deficiency, unspecified: Secondary | ICD-10-CM | POA: Diagnosis not present

## 2021-03-27 DIAGNOSIS — Z01419 Encounter for gynecological examination (general) (routine) without abnormal findings: Secondary | ICD-10-CM | POA: Diagnosis not present

## 2021-03-27 DIAGNOSIS — Z853 Personal history of malignant neoplasm of breast: Secondary | ICD-10-CM | POA: Diagnosis not present

## 2021-03-27 DIAGNOSIS — Z780 Asymptomatic menopausal state: Secondary | ICD-10-CM | POA: Diagnosis not present

## 2021-06-12 ENCOUNTER — Other Ambulatory Visit: Payer: Self-pay | Admitting: Family Medicine

## 2021-06-12 DIAGNOSIS — Z1231 Encounter for screening mammogram for malignant neoplasm of breast: Secondary | ICD-10-CM

## 2021-07-10 ENCOUNTER — Ambulatory Visit
Admission: RE | Admit: 2021-07-10 | Discharge: 2021-07-10 | Disposition: A | Discharge status: Home / Self Care | Payer: Medicare Other | Source: Ambulatory Visit | Attending: Family Medicine | Admitting: Family Medicine

## 2021-07-10 ENCOUNTER — Other Ambulatory Visit: Payer: Self-pay

## 2021-07-10 DIAGNOSIS — Z1231 Encounter for screening mammogram for malignant neoplasm of breast: Secondary | ICD-10-CM | POA: Diagnosis not present

## 2021-08-06 DIAGNOSIS — L814 Other melanin hyperpigmentation: Secondary | ICD-10-CM | POA: Diagnosis not present

## 2021-08-06 DIAGNOSIS — L821 Other seborrheic keratosis: Secondary | ICD-10-CM | POA: Diagnosis not present

## 2021-08-06 DIAGNOSIS — D225 Melanocytic nevi of trunk: Secondary | ICD-10-CM | POA: Diagnosis not present

## 2021-08-06 DIAGNOSIS — L570 Actinic keratosis: Secondary | ICD-10-CM | POA: Diagnosis not present

## 2021-10-27 IMAGING — MG MM DIGITAL DIAGNOSTIC UNILAT*L* W/ TOMO W/ CAD
4 series · 4 of 12 positions shown · non-contrast
Comparison: Previous exam(s).

CLINICAL DATA: 80-year-old female presenting as a recall from
screening for possible left breast asymmetry.

EXAM:
DIGITAL DIAGNOSTIC UNILATERAL LEFT MAMMOGRAM WITH TOMOSYNTHESIS AND
CAD; ULTRASOUND LEFT BREAST LIMITED
TECHNIQUE: Left digital diagnostic mammography and breast tomosynthesis was
performed. The images were evaluated with computer-aided detection.;
Targeted ultrasound examination of the left breast was performed

[L CC synth-2D]
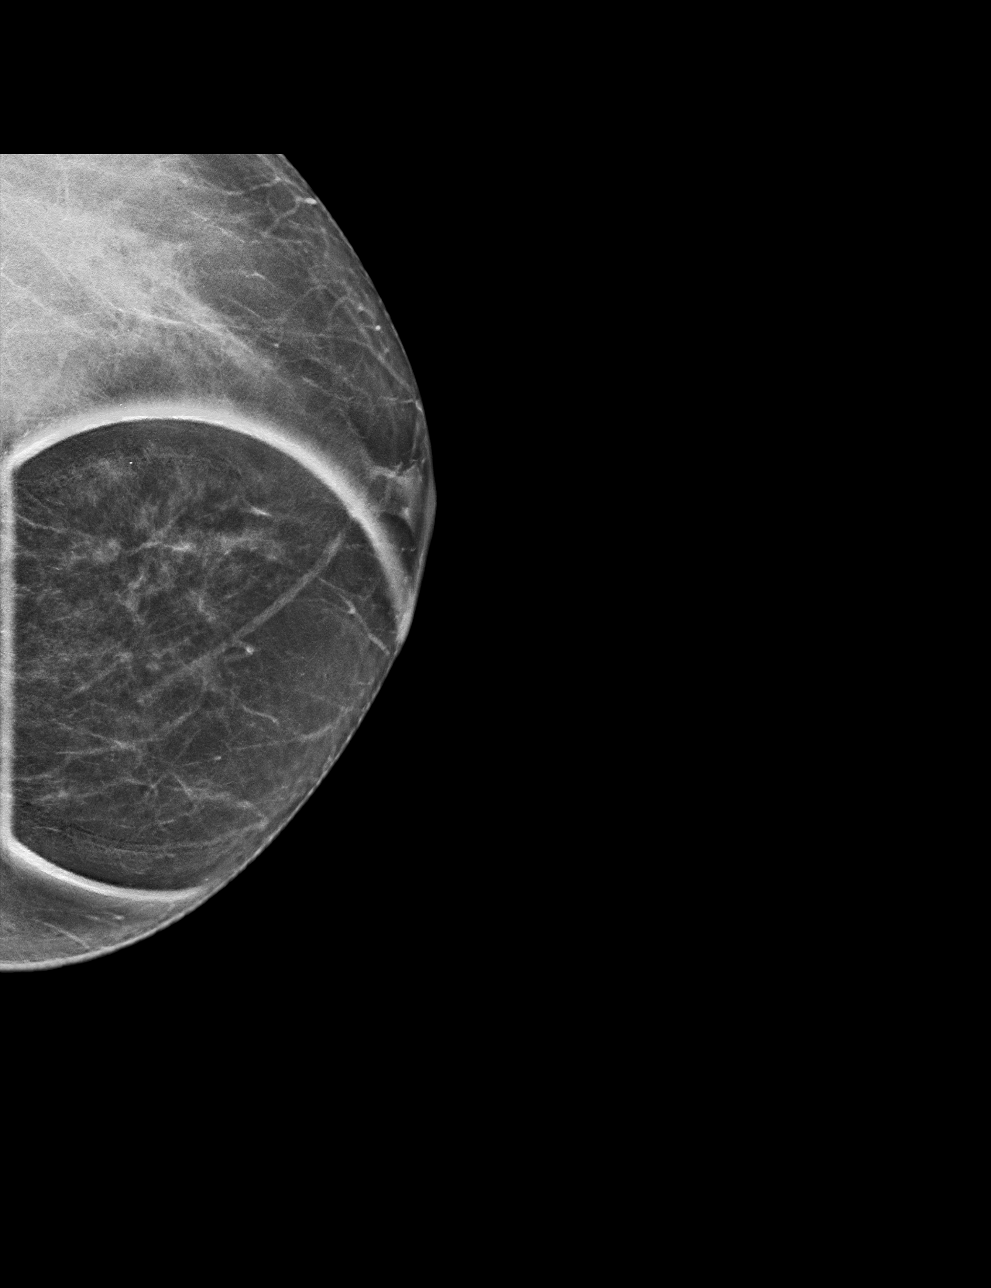

[L ML synth-2D]
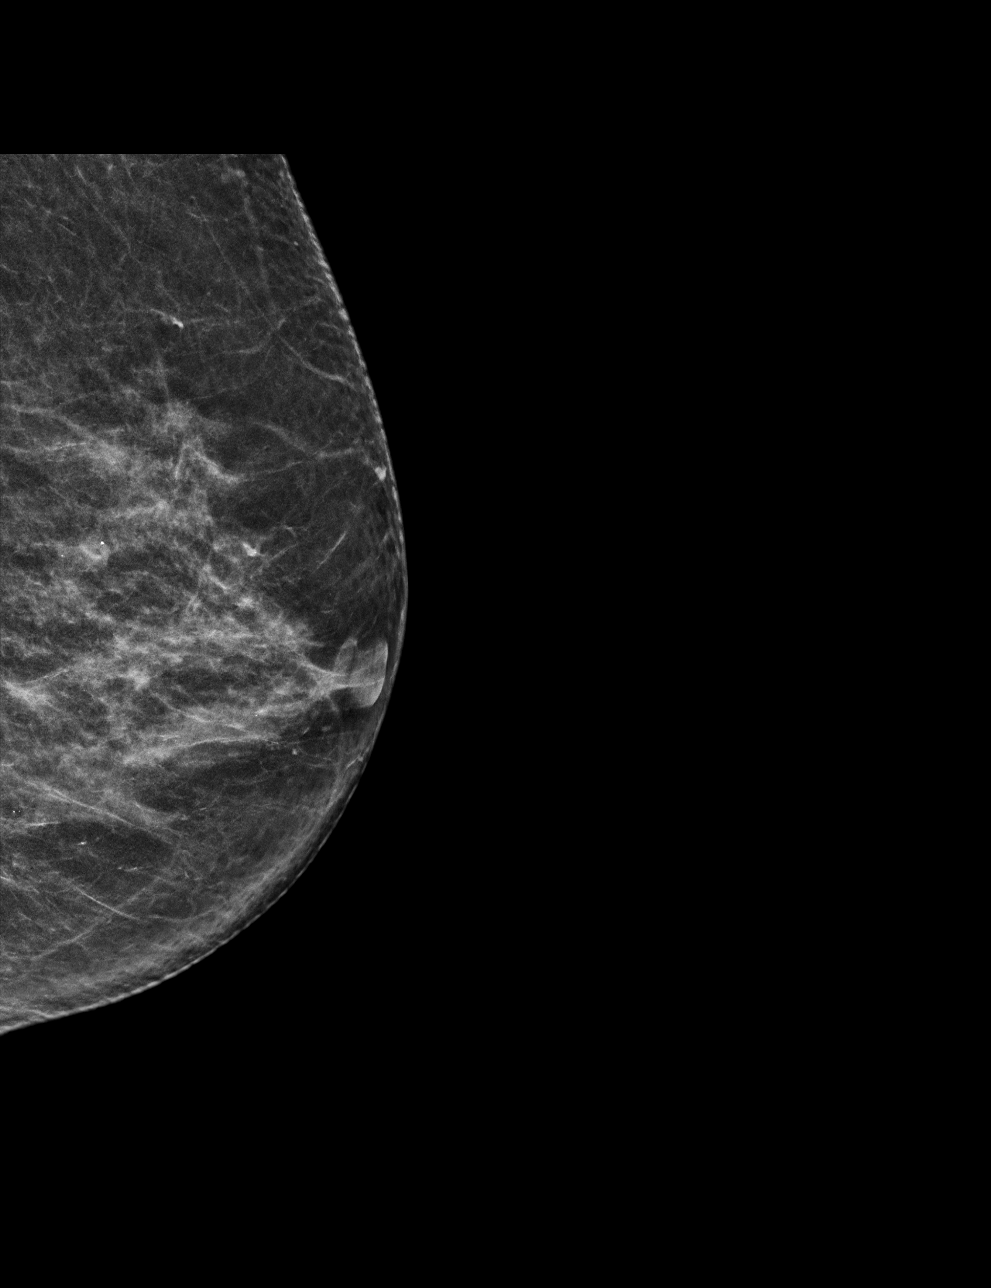

[L CC tomo · tomo slice 29/56.0]
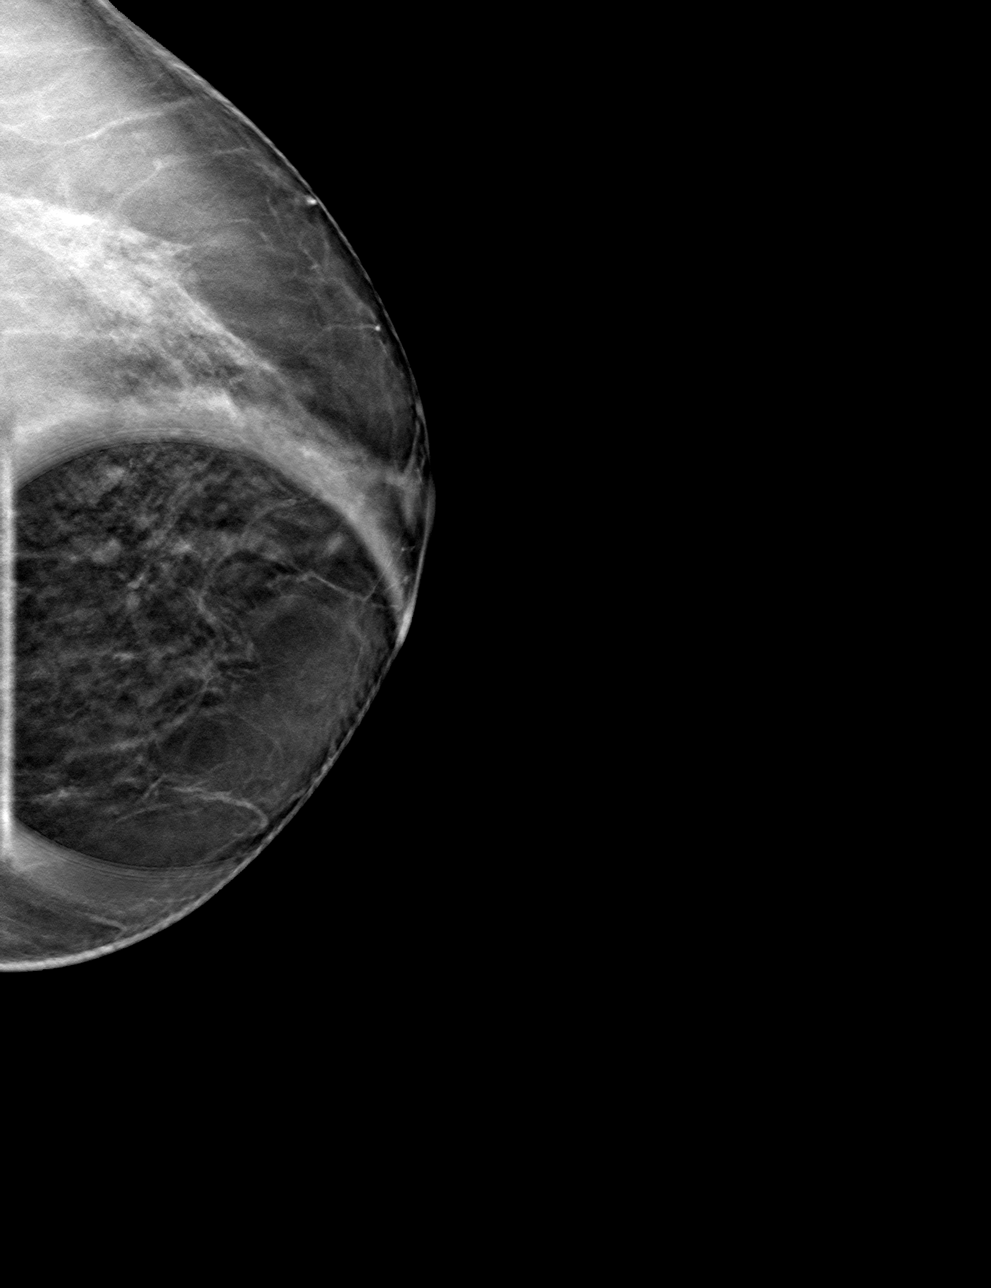

[L ML tomo · tomo slice 30/59.0]
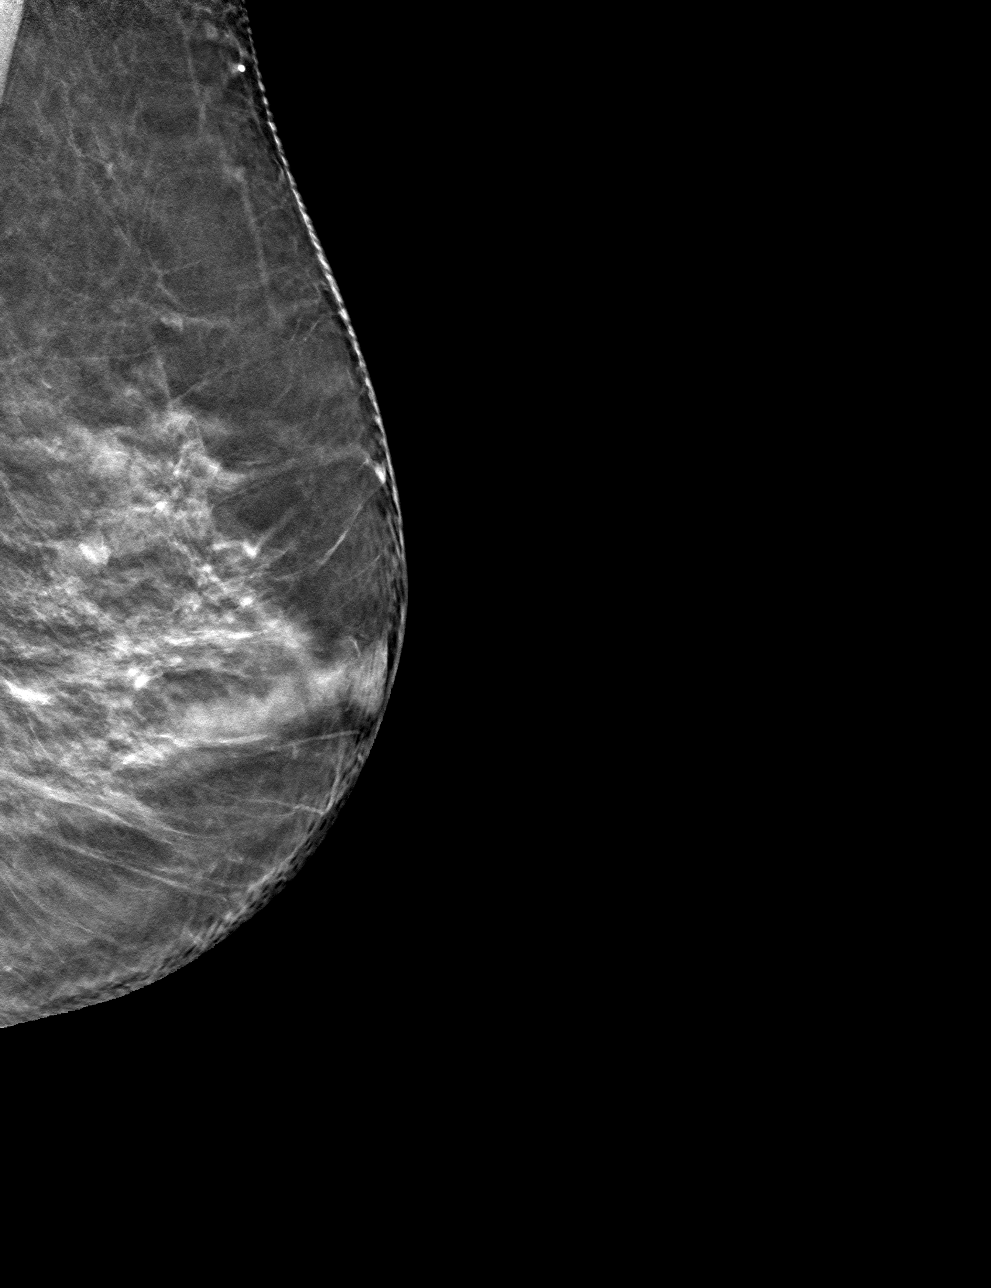

[4 of 12 positions shown; findings below may reference images not displayed]

ACR Breast Density Category b: There are scattered areas of
fibroglandular density.
FINDINGS: Mammogram:

Spot compression cc and full field mL tomosynthesis views of the
left breast performed. There is persistence of an oval circumscribed
mass in the upper inner left breast measuring approximately 0.5 cm.
No new findings on today's imaging.

Ultrasound:

Targeted ultrasound is performed in the left breast at 10 o'clock 4
cm from the nipple demonstrating an oval circumscribed anechoic mass
measuring 0.5 x 0.3 x 0.4 cm, consistent with a benign simple cyst.
No internal vascularity. This corresponds to the mammographic
finding.
IMPRESSION: Benign simple cyst in the left breast at 10 o'clock measuring
cm.

RECOMMENDATION:
Screening mammogram in one year.(Code:B4-X-OA8)

I have discussed the findings and recommendations with the patient.
If applicable, a reminder letter will be sent to the patient
regarding the next appointment.

BI-RADS CATEGORY  2: Benign.

## 2022-02-17 DIAGNOSIS — Z0000 Encounter for general adult medical examination without abnormal findings: Secondary | ICD-10-CM | POA: Diagnosis not present

## 2022-02-17 DIAGNOSIS — R7309 Other abnormal glucose: Secondary | ICD-10-CM | POA: Diagnosis not present

## 2022-02-17 DIAGNOSIS — Z79899 Other long term (current) drug therapy: Secondary | ICD-10-CM | POA: Diagnosis not present

## 2022-02-17 DIAGNOSIS — E559 Vitamin D deficiency, unspecified: Secondary | ICD-10-CM | POA: Diagnosis not present

## 2022-02-17 DIAGNOSIS — R7989 Other specified abnormal findings of blood chemistry: Secondary | ICD-10-CM | POA: Diagnosis not present

## 2022-02-24 DIAGNOSIS — R7989 Other specified abnormal findings of blood chemistry: Secondary | ICD-10-CM | POA: Diagnosis not present

## 2022-02-24 DIAGNOSIS — M810 Age-related osteoporosis without current pathological fracture: Secondary | ICD-10-CM | POA: Diagnosis not present

## 2022-02-24 DIAGNOSIS — Z0000 Encounter for general adult medical examination without abnormal findings: Secondary | ICD-10-CM | POA: Diagnosis not present

## 2022-02-24 DIAGNOSIS — F419 Anxiety disorder, unspecified: Secondary | ICD-10-CM | POA: Diagnosis not present

## 2022-02-24 DIAGNOSIS — E559 Vitamin D deficiency, unspecified: Secondary | ICD-10-CM | POA: Diagnosis not present

## 2022-02-24 DIAGNOSIS — N1831 Chronic kidney disease, stage 3a: Secondary | ICD-10-CM | POA: Diagnosis not present

## 2022-02-24 DIAGNOSIS — E785 Hyperlipidemia, unspecified: Secondary | ICD-10-CM | POA: Diagnosis not present

## 2022-02-24 DIAGNOSIS — Z79899 Other long term (current) drug therapy: Secondary | ICD-10-CM | POA: Diagnosis not present

## 2022-03-13 DIAGNOSIS — Z23 Encounter for immunization: Secondary | ICD-10-CM | POA: Diagnosis not present

## 2022-05-28 ENCOUNTER — Other Ambulatory Visit: Payer: Self-pay | Admitting: Family Medicine

## 2022-05-28 DIAGNOSIS — Z1231 Encounter for screening mammogram for malignant neoplasm of breast: Secondary | ICD-10-CM

## 2022-07-08 ENCOUNTER — Ambulatory Visit
Admission: RE | Admit: 2022-07-08 | Discharge: 2022-07-08 | Disposition: A | Discharge status: Home / Self Care | Payer: Medicare Other | Source: Ambulatory Visit | Attending: Family Medicine | Admitting: Family Medicine

## 2022-07-08 DIAGNOSIS — Z1231 Encounter for screening mammogram for malignant neoplasm of breast: Secondary | ICD-10-CM

## 2022-07-16 ENCOUNTER — Ambulatory Visit: Payer: Medicare Other

## 2022-08-07 DIAGNOSIS — L821 Other seborrheic keratosis: Secondary | ICD-10-CM | POA: Diagnosis not present

## 2022-08-07 DIAGNOSIS — L814 Other melanin hyperpigmentation: Secondary | ICD-10-CM | POA: Diagnosis not present

## 2022-08-07 DIAGNOSIS — D225 Melanocytic nevi of trunk: Secondary | ICD-10-CM | POA: Diagnosis not present

## 2022-08-07 DIAGNOSIS — L820 Inflamed seborrheic keratosis: Secondary | ICD-10-CM | POA: Diagnosis not present

## 2022-08-07 DIAGNOSIS — L298 Other pruritus: Secondary | ICD-10-CM | POA: Diagnosis not present

## 2022-08-21 ENCOUNTER — Ambulatory Visit
Admission: RE | Admit: 2022-08-21 | Discharge: 2022-08-21 | Disposition: A | Discharge status: Home / Self Care | Payer: Medicare Other | Source: Ambulatory Visit | Attending: Family Medicine | Admitting: Family Medicine

## 2022-08-21 DIAGNOSIS — Z1231 Encounter for screening mammogram for malignant neoplasm of breast: Secondary | ICD-10-CM | POA: Diagnosis not present

## 2023-02-23 DIAGNOSIS — R7309 Other abnormal glucose: Secondary | ICD-10-CM | POA: Diagnosis not present

## 2023-02-23 DIAGNOSIS — E785 Hyperlipidemia, unspecified: Secondary | ICD-10-CM | POA: Diagnosis not present

## 2023-02-23 DIAGNOSIS — E559 Vitamin D deficiency, unspecified: Secondary | ICD-10-CM | POA: Diagnosis not present

## 2023-02-23 DIAGNOSIS — R7989 Other specified abnormal findings of blood chemistry: Secondary | ICD-10-CM | POA: Diagnosis not present

## 2023-02-23 DIAGNOSIS — Z79899 Other long term (current) drug therapy: Secondary | ICD-10-CM | POA: Diagnosis not present

## 2023-03-02 DIAGNOSIS — Z23 Encounter for immunization: Secondary | ICD-10-CM | POA: Diagnosis not present

## 2023-03-02 DIAGNOSIS — M810 Age-related osteoporosis without current pathological fracture: Secondary | ICD-10-CM | POA: Diagnosis not present

## 2023-03-02 DIAGNOSIS — E559 Vitamin D deficiency, unspecified: Secondary | ICD-10-CM | POA: Diagnosis not present

## 2023-03-02 DIAGNOSIS — N1831 Chronic kidney disease, stage 3a: Secondary | ICD-10-CM | POA: Diagnosis not present

## 2023-03-02 DIAGNOSIS — F419 Anxiety disorder, unspecified: Secondary | ICD-10-CM | POA: Diagnosis not present

## 2023-03-02 DIAGNOSIS — R7309 Other abnormal glucose: Secondary | ICD-10-CM | POA: Diagnosis not present

## 2023-03-02 DIAGNOSIS — Z79899 Other long term (current) drug therapy: Secondary | ICD-10-CM | POA: Diagnosis not present

## 2023-03-02 DIAGNOSIS — R7989 Other specified abnormal findings of blood chemistry: Secondary | ICD-10-CM | POA: Diagnosis not present

## 2023-03-02 DIAGNOSIS — Z0000 Encounter for general adult medical examination without abnormal findings: Secondary | ICD-10-CM | POA: Diagnosis not present

## 2023-04-23 DIAGNOSIS — Z01419 Encounter for gynecological examination (general) (routine) without abnormal findings: Secondary | ICD-10-CM | POA: Diagnosis not present

## 2023-04-23 DIAGNOSIS — Z780 Asymptomatic menopausal state: Secondary | ICD-10-CM | POA: Diagnosis not present

## 2023-04-23 DIAGNOSIS — Z853 Personal history of malignant neoplasm of breast: Secondary | ICD-10-CM | POA: Diagnosis not present

## 2023-07-08 ENCOUNTER — Other Ambulatory Visit: Payer: Self-pay | Admitting: Family Medicine

## 2023-07-08 DIAGNOSIS — Z1231 Encounter for screening mammogram for malignant neoplasm of breast: Secondary | ICD-10-CM

## 2023-08-17 DIAGNOSIS — L814 Other melanin hyperpigmentation: Secondary | ICD-10-CM | POA: Diagnosis not present

## 2023-08-17 DIAGNOSIS — L821 Other seborrheic keratosis: Secondary | ICD-10-CM | POA: Diagnosis not present

## 2023-08-17 DIAGNOSIS — L538 Other specified erythematous conditions: Secondary | ICD-10-CM | POA: Diagnosis not present

## 2023-08-17 DIAGNOSIS — L820 Inflamed seborrheic keratosis: Secondary | ICD-10-CM | POA: Diagnosis not present

## 2023-08-17 DIAGNOSIS — L2089 Other atopic dermatitis: Secondary | ICD-10-CM | POA: Diagnosis not present

## 2023-08-17 DIAGNOSIS — D492 Neoplasm of unspecified behavior of bone, soft tissue, and skin: Secondary | ICD-10-CM | POA: Diagnosis not present

## 2023-08-17 DIAGNOSIS — D225 Melanocytic nevi of trunk: Secondary | ICD-10-CM | POA: Diagnosis not present

## 2023-08-26 ENCOUNTER — Ambulatory Visit: Payer: Medicare Other

## 2023-08-26 ENCOUNTER — Ambulatory Visit
Admission: RE | Admit: 2023-08-26 | Discharge: 2023-08-26 | Disposition: A | Discharge status: Home / Self Care | Source: Ambulatory Visit | Attending: Family Medicine | Admitting: Family Medicine

## 2023-08-26 DIAGNOSIS — Z1231 Encounter for screening mammogram for malignant neoplasm of breast: Secondary | ICD-10-CM

## 2023-08-31 ENCOUNTER — Other Ambulatory Visit: Payer: Self-pay | Admitting: Family Medicine

## 2023-08-31 DIAGNOSIS — R928 Other abnormal and inconclusive findings on diagnostic imaging of breast: Secondary | ICD-10-CM

## 2023-09-09 ENCOUNTER — Ambulatory Visit
Admission: RE | Admit: 2023-09-09 | Discharge: 2023-09-09 | Disposition: A | Discharge status: Home / Self Care | Source: Ambulatory Visit | Attending: Family Medicine | Admitting: Family Medicine

## 2023-09-09 DIAGNOSIS — R928 Other abnormal and inconclusive findings on diagnostic imaging of breast: Secondary | ICD-10-CM

## 2023-09-09 DIAGNOSIS — N6002 Solitary cyst of left breast: Secondary | ICD-10-CM | POA: Diagnosis not present

## 2024-02-29 DIAGNOSIS — R7989 Other specified abnormal findings of blood chemistry: Secondary | ICD-10-CM | POA: Diagnosis not present

## 2024-02-29 DIAGNOSIS — E559 Vitamin D deficiency, unspecified: Secondary | ICD-10-CM | POA: Diagnosis not present

## 2024-02-29 DIAGNOSIS — Z79899 Other long term (current) drug therapy: Secondary | ICD-10-CM | POA: Diagnosis not present

## 2024-02-29 DIAGNOSIS — Z13220 Encounter for screening for lipoid disorders: Secondary | ICD-10-CM | POA: Diagnosis not present

## 2024-02-29 DIAGNOSIS — R7309 Other abnormal glucose: Secondary | ICD-10-CM | POA: Diagnosis not present

## 2024-02-29 DIAGNOSIS — N1831 Chronic kidney disease, stage 3a: Secondary | ICD-10-CM | POA: Diagnosis not present

## 2024-02-29 DIAGNOSIS — M810 Age-related osteoporosis without current pathological fracture: Secondary | ICD-10-CM | POA: Diagnosis not present

## 2024-03-01 DIAGNOSIS — H2513 Age-related nuclear cataract, bilateral: Secondary | ICD-10-CM | POA: Diagnosis not present

## 2024-03-01 DIAGNOSIS — H5203 Hypermetropia, bilateral: Secondary | ICD-10-CM | POA: Diagnosis not present

## 2024-03-01 DIAGNOSIS — H25013 Cortical age-related cataract, bilateral: Secondary | ICD-10-CM | POA: Diagnosis not present

## 2024-03-01 DIAGNOSIS — H524 Presbyopia: Secondary | ICD-10-CM | POA: Diagnosis not present

## 2024-03-01 DIAGNOSIS — H353131 Nonexudative age-related macular degeneration, bilateral, early dry stage: Secondary | ICD-10-CM | POA: Diagnosis not present

## 2024-03-07 DIAGNOSIS — M810 Age-related osteoporosis without current pathological fracture: Secondary | ICD-10-CM | POA: Diagnosis not present

## 2024-03-07 DIAGNOSIS — Z79899 Other long term (current) drug therapy: Secondary | ICD-10-CM | POA: Diagnosis not present

## 2024-03-07 DIAGNOSIS — N1831 Chronic kidney disease, stage 3a: Secondary | ICD-10-CM | POA: Diagnosis not present

## 2024-03-07 DIAGNOSIS — R7309 Other abnormal glucose: Secondary | ICD-10-CM | POA: Diagnosis not present

## 2024-03-07 DIAGNOSIS — R946 Abnormal results of thyroid function studies: Secondary | ICD-10-CM | POA: Diagnosis not present

## 2024-03-07 DIAGNOSIS — Z23 Encounter for immunization: Secondary | ICD-10-CM | POA: Diagnosis not present

## 2024-03-07 DIAGNOSIS — E559 Vitamin D deficiency, unspecified: Secondary | ICD-10-CM | POA: Diagnosis not present

## 2024-03-07 DIAGNOSIS — F419 Anxiety disorder, unspecified: Secondary | ICD-10-CM | POA: Diagnosis not present

## 2024-03-07 DIAGNOSIS — Z0000 Encounter for general adult medical examination without abnormal findings: Secondary | ICD-10-CM | POA: Diagnosis not present
